# Patient Record
Sex: Male | Born: 1945 | Race: Black or African American | Hispanic: No | Marital: Married | Smoking: Never smoker
Health system: Southern US, Community
[De-identification: ages and names within clinical notes are randomized; demographics above are authoritative.]

## PROBLEM LIST (undated history)

## (undated) DIAGNOSIS — I10 Essential (primary) hypertension: Secondary | ICD-10-CM

## (undated) DIAGNOSIS — R972 Elevated prostate specific antigen [PSA]: Secondary | ICD-10-CM

## (undated) DIAGNOSIS — E785 Hyperlipidemia, unspecified: Secondary | ICD-10-CM

## (undated) DIAGNOSIS — T7840XA Allergy, unspecified, initial encounter: Secondary | ICD-10-CM

## (undated) HISTORY — DX: Allergy, unspecified, initial encounter: T78.40XA

## (undated) HISTORY — DX: Elevated prostate specific antigen (PSA): R97.20

## (undated) HISTORY — PX: COLONOSCOPY: SHX174

## (undated) HISTORY — PX: BIOPSY PROSTATE: PRO28

---

## 1999-08-27 ENCOUNTER — Emergency Department (HOSPITAL_COMMUNITY): Admission: EM | Admit: 1999-08-27 | Discharge: 1999-08-27 | Payer: Self-pay | Admitting: Emergency Medicine

## 2005-02-02 ENCOUNTER — Ambulatory Visit: Payer: Self-pay | Admitting: Internal Medicine

## 2005-02-15 ENCOUNTER — Ambulatory Visit: Payer: Self-pay | Admitting: Internal Medicine

## 2007-11-18 ENCOUNTER — Emergency Department (HOSPITAL_COMMUNITY): Admission: EM | Admit: 2007-11-18 | Discharge: 2007-11-18 | Payer: Self-pay | Admitting: Family Medicine

## 2010-11-04 ENCOUNTER — Inpatient Hospital Stay (INDEPENDENT_AMBULATORY_CARE_PROVIDER_SITE_OTHER)
Admission: RE | Admit: 2010-11-04 | Discharge: 2010-11-04 | Disposition: A | Discharge status: Home / Self Care | Payer: 59 | Source: Ambulatory Visit | Attending: Family Medicine | Admitting: Family Medicine

## 2010-11-04 DIAGNOSIS — J019 Acute sinusitis, unspecified: Secondary | ICD-10-CM

## 2010-11-04 DIAGNOSIS — R05 Cough: Secondary | ICD-10-CM

## 2012-06-03 ENCOUNTER — Emergency Department (HOSPITAL_COMMUNITY)
Admission: EM | Admit: 2012-06-03 | Discharge: 2012-06-03 | Disposition: A | Discharge status: Home / Self Care | Payer: 59 | Source: Home / Self Care | Attending: Emergency Medicine | Admitting: Emergency Medicine

## 2012-06-03 ENCOUNTER — Encounter (HOSPITAL_COMMUNITY): Payer: Self-pay | Admitting: Emergency Medicine

## 2012-06-03 DIAGNOSIS — M5430 Sciatica, unspecified side: Secondary | ICD-10-CM

## 2012-06-03 HISTORY — DX: Hyperlipidemia, unspecified: E78.5

## 2012-06-03 MED ORDER — PREDNISONE 5 MG PO KIT: 1.0000 | PACK | Freq: Every day | ORAL | Status: DC

## 2012-06-03 MED ORDER — MELOXICAM 15 MG PO TABS: 15.0000 mg | ORAL_TABLET | Freq: Every day | ORAL | Status: DC

## 2012-06-03 MED ORDER — CYCLOBENZAPRINE HCL 5 MG PO TABS: 5.0000 mg | ORAL_TABLET | Freq: Three times a day (TID) | ORAL | Status: DC | PRN

## 2012-06-03 MED ORDER — HYDROCODONE-ACETAMINOPHEN 5-325 MG PO TABS: ORAL_TABLET | ORAL | Status: DC

## 2012-06-03 NOTE — ED Notes (Signed)
C/o back pain radiating to left leg.  Patient states this started last Wednesday.  Denies injury to back.  Patches and OTC medication tried but no relief.

## 2012-06-03 NOTE — ED Provider Notes (Signed)
Chief Complaint  Patient presents with  . Back Pain    History of Present Illness:   Gabriel Gardner is a 66 year old male who has had a four-day history of lower back pain radiating down his left leg to the ankle. He denies any injury. He's had no prior history of back pain. The pain is rated 9/10 in intensity. The pain is worse if he gets up from a sitting position, bends, lifts and better if he is active walks a little bit. There is no numbness or tingling in the leg, no bladder or bowel complaints. No abdominal pain, fever, chills, or weight loss.  Review of Systems:  Other than noted above, the patient denies any of the following symptoms: Systemic:  No fever, chills, severe fatigue, or unexplained weight loss. GI:  No abdominal pain, nausea, vomiting, diarrhea, constipation, incontinence of bowel, or blood in stool. GU:  No dysuria, frequency, urgency, or hematuria. No incontinence of urine or difficulty urinating.  M-S:  No neck pain, joint pain, arthritis, or myalgias. Neuro:  No paresthesias, saddle anesthesia, muscular weakness, or progressive neurological deficit.  PMFSH:  Past medical history, family history, social history, meds, and allergies were reviewed. Specifically, there is no history of cancer, major trauma, osteoporosis, immunosuppression, HIV, or IV or injection drug use.   Physical Exam:   Vital signs:  BP 135/85  Pulse 78  Temp 97.7 F (36.5 C) (Oral)  Resp 18  SpO2 94% General:  Alert, oriented, in no distress. Abdomen:  Soft, non-tender.  No organomegaly or mass.  No pulsatile midline abdominal mass or bruit. Back:  There was tenderness to palpation in the left lower back just above the sacroiliac joint. The back had a full range of motion with pain. Straight leg raising was positive on the left with a positive Lasegue's sign and positive popliteal compression. Neuro:  Normal muscle strength, sensations and DTRs. Extremities: Pedal pulses were full, there was no  edema. Skin:  Clear, warm and dry.  No rash.  Assessment:  The encounter diagnosis was Sciatica.  Plan:   1.  The following meds were prescribed:   New Prescriptions   CYCLOBENZAPRINE (FLEXERIL) 5 MG TABLET    Take 1 tablet (5 mg total) by mouth 3 (three) times daily as needed for muscle spasms.   HYDROCODONE-ACETAMINOPHEN (NORCO/VICODIN) 5-325 MG PER TABLET    1 to 2 tabs every 4 to 6 hours as needed for pain.   MELOXICAM (MOBIC) 15 MG TABLET    Take 1 tablet (15 mg total) by mouth daily.   PREDNISONE 5 MG KIT    Take 1 kit (5 mg total) by mouth daily after breakfast. Prednisone 5 mg 6 day dosepack.  Take as directed.   2.  The patient was instructed in symptomatic care and handouts were given. 3.  The patient was told to return if becoming worse in any way, if no better in 2 weeks, and given some red flag symptoms that would indicate earlier return. 4.  The patient was encouraged to try to be as active as possible and given some exercises to do followed by moist heat.    Reuben Likes, MD 06/03/12 2010

## 2014-07-31 DIAGNOSIS — R310 Gross hematuria: Secondary | ICD-10-CM | POA: Diagnosis not present

## 2014-08-12 DIAGNOSIS — J019 Acute sinusitis, unspecified: Secondary | ICD-10-CM | POA: Diagnosis not present

## 2014-08-14 DIAGNOSIS — N281 Cyst of kidney, acquired: Secondary | ICD-10-CM | POA: Diagnosis not present

## 2014-08-14 DIAGNOSIS — R310 Gross hematuria: Secondary | ICD-10-CM | POA: Diagnosis not present

## 2014-08-14 DIAGNOSIS — N3289 Other specified disorders of bladder: Secondary | ICD-10-CM | POA: Diagnosis not present

## 2014-08-14 DIAGNOSIS — N401 Enlarged prostate with lower urinary tract symptoms: Secondary | ICD-10-CM | POA: Diagnosis not present

## 2014-09-18 DIAGNOSIS — N5201 Erectile dysfunction due to arterial insufficiency: Secondary | ICD-10-CM | POA: Diagnosis not present

## 2014-09-18 DIAGNOSIS — R3912 Poor urinary stream: Secondary | ICD-10-CM | POA: Diagnosis not present

## 2014-09-18 DIAGNOSIS — R972 Elevated prostate specific antigen [PSA]: Secondary | ICD-10-CM | POA: Diagnosis not present

## 2014-09-18 DIAGNOSIS — R310 Gross hematuria: Secondary | ICD-10-CM | POA: Diagnosis not present

## 2014-09-18 DIAGNOSIS — N401 Enlarged prostate with lower urinary tract symptoms: Secondary | ICD-10-CM | POA: Diagnosis not present

## 2014-11-04 DIAGNOSIS — J019 Acute sinusitis, unspecified: Secondary | ICD-10-CM | POA: Diagnosis not present

## 2014-11-21 DIAGNOSIS — E785 Hyperlipidemia, unspecified: Secondary | ICD-10-CM | POA: Diagnosis not present

## 2014-11-21 DIAGNOSIS — J309 Allergic rhinitis, unspecified: Secondary | ICD-10-CM | POA: Diagnosis not present

## 2014-11-21 DIAGNOSIS — Z0000 Encounter for general adult medical examination without abnormal findings: Secondary | ICD-10-CM | POA: Diagnosis not present

## 2014-11-25 ENCOUNTER — Encounter: Payer: Self-pay | Admitting: Internal Medicine

## 2015-01-27 ENCOUNTER — Ambulatory Visit (AMBULATORY_SURGERY_CENTER): Payer: Self-pay | Admitting: *Deleted

## 2015-01-27 VITALS — Ht 69.75 in | Wt 190.0 lb

## 2015-01-27 DIAGNOSIS — Z1211 Encounter for screening for malignant neoplasm of colon: Secondary | ICD-10-CM

## 2015-01-27 NOTE — Progress Notes (Signed)
Patient denies any allergies to egg or soy products. Patient denies complications with anesthesia/sedation.  Patient denies oxygen use at home and denies diet medications. Emmi instructions for colonoscopy offered to patient but not interested in Emmi.

## 2015-01-29 ENCOUNTER — Encounter: Payer: Self-pay | Admitting: Internal Medicine

## 2015-02-10 ENCOUNTER — Ambulatory Visit (AMBULATORY_SURGERY_CENTER): Payer: Commercial Managed Care - HMO | Admitting: Internal Medicine

## 2015-02-10 ENCOUNTER — Encounter: Payer: Self-pay | Admitting: Internal Medicine

## 2015-02-10 VITALS — BP 147/84 | HR 84 | Temp 97.6°F | Resp 13 | Ht 69.0 in | Wt 190.0 lb

## 2015-02-10 DIAGNOSIS — Z1211 Encounter for screening for malignant neoplasm of colon: Secondary | ICD-10-CM

## 2015-02-10 DIAGNOSIS — N400 Enlarged prostate without lower urinary tract symptoms: Secondary | ICD-10-CM | POA: Diagnosis not present

## 2015-02-10 DIAGNOSIS — E669 Obesity, unspecified: Secondary | ICD-10-CM | POA: Diagnosis not present

## 2015-02-10 MED ORDER — SODIUM CHLORIDE 0.9 % IV SOLN: 500.0000 mL | INTRAVENOUS | Status: DC

## 2015-02-10 NOTE — Patient Instructions (Signed)
YOU HAD AN ENDOSCOPIC PROCEDURE TODAY AT [facility]:   Refer to the procedure report that was given to you for any specific questions about what was found during the examination.  If the procedure report does not answer your questions, please call your gastroenterologist to clarify.  If you requested that your care partner not be given the details of your procedure findings, then the procedure report has been included in a sealed envelope for you to review at your convenience later.  YOU SHOULD EXPECT: Some feelings of bloating in the abdomen. Passage of more gas than usual.  Walking can help get rid of the air that was put into your GI tract during the procedure and reduce the bloating. If you had a lower endoscopy (such as a colonoscopy or flexible sigmoidoscopy) you may notice spotting of blood in your stool or on the toilet paper. If you underwent a bowel prep for your procedure, you may not have a normal bowel movement for a few days.  Please Note:  You might notice some irritation and congestion in your nose or some drainage.  This is from the oxygen used during your procedure.  There is no need for concern and it should clear up in a day or so.  SYMPTOMS TO REPORT IMMEDIATELY:   Following lower endoscopy (colonoscopy or flexible sigmoidoscopy):  Excessive amounts of blood in the stool  Significant tenderness or worsening of abdominal pains  Swelling of the abdomen that is new, acute  Fever of 100F or higher  For urgent or emergent issues, a gastroenterologist can be reached at any hour by calling [phone].   DIET: Your first meal following the procedure should be a small meal and then it is ok to progress to your normal diet. Heavy or fried foods are harder to digest and may make you feel nauseous or bloated.  Likewise, meals heavy in dairy and vegetables can increase bloating.  Drink plenty of fluids but you should avoid alcoholic beverages for 24 hours. Try to  increase the fiber in your diet.  ACTIVITY:  You should plan to take it easy for the rest of today and you should NOT DRIVE or use heavy machinery until tomorrow (because of the sedation medicines used during the test).    FOLLOW UP: Our staff will call the number listed on your records the next business day following your procedure to check on you and address any questions or concerns that you may have regarding the information given to you following your procedure. If we do not reach you, we will leave a message.  However, if you are feeling well and you are not experiencing any problems, there is no need to return our call.  We will assume that you have returned to your regular daily activities without incident.  If any biopsies were taken you will be contacted by phone or by letter within the next 1-3 weeks.  Please call us at [phone] if you have not heard about the biopsies in 3 weeks.    SIGNATURES/CONFIDENTIALITY: You and/or your care partner have signed paperwork which will be entered into your electronic medical record.  These signatures attest to the fact that that the information above on your After Visit Summary has been reviewed and is understood.  Full responsibility of the confidentiality of this discharge information lies with you and/or your care-partner.  Read all of the handouts given to you by your recovery room nurse. Thank-you for choosing [place]  for your healthcare needs.

## 2015-02-10 NOTE — Progress Notes (Signed)
Report to PACU, RN, vss, BBS= Clear.  

## 2015-02-10 NOTE — Op Note (Signed)
[facility]  [address] [place], [id number]   COLONOSCOPY PROCEDURE REPORT  PATIENT: Gabriel Gardner, Gabriel Gardner  MR#: [id number] BIRTHDATE: March 14, 1946 , 58  yrs. old GENDER: male ENDOSCOPIST: Eustace Quail, MD REFERRED FS:[id number] Recall, M.D. PROCEDURE DATE:  02/10/2015 PROCEDURE:   Colonoscopy, screening First Screening Colonoscopy - Avg.  risk and is 50 yrs.  old or older - No.  Prior Negative Screening - Now for repeat screening. 10 or more years since last screening  History of Adenoma - Now for follow-up colonoscopy & has been > or = to 3 yrs.  N/A  Polyps removed today? No Recommend repeat exam, <10 yrs? No ASA CLASS:   Class II INDICATIONS:Screening for colonic neoplasia and Colorectal Neoplasm Risk Assessment for this procedure is average risk. Negative index exam 02-2005 MEDICATIONS: Propofol 200 mg IV and Monitored anesthesia care  DESCRIPTION OF PROCEDURE:   After the risks benefits and alternatives of the procedure were thoroughly explained, informed consent was obtained.  The digital rectal exam revealed no abnormalities of the rectum.   The LB [id number]  endoscope was introduced through the anus and advanced to the cecum, which was identified by both the appendix and ileocecal valve. No adverse events experienced.   The quality of the prep was excellent. (MiraLax was used)  The instrument was then slowly withdrawn as the colon was fully examined. Estimated blood loss is zero unless otherwise noted in this procedure report.    COLON FINDINGS: There several scattered diverticula.   The examination was otherwise normal.  Retroflexed views revealed no abnormalities. The time to cecum = 2.7 Withdrawal time = 9.2   The scope was withdrawn and the procedure completed. COMPLICATIONS: There were no immediate complications.  ENDOSCOPIC IMPRESSION: 1.   Diverticulosis 2.   The examination was otherwise normal  RECOMMENDATIONS: 1. Continue current  colorectal screening recommendations for "routine risk" patients with a repeat colonoscopy in 10 years.  eSigned:  Eustace Quail, MD 02/10/2015 9:50 AM   cc: The Patient and Juluis Pitch, MD

## 2015-02-11 ENCOUNTER — Telehealth: Payer: Self-pay | Admitting: *Deleted

## 2015-02-11 NOTE — Telephone Encounter (Signed)
Message left

## 2015-09-18 DIAGNOSIS — N281 Cyst of kidney, acquired: Secondary | ICD-10-CM | POA: Diagnosis not present

## 2015-09-18 DIAGNOSIS — R972 Elevated prostate specific antigen [PSA]: Secondary | ICD-10-CM | POA: Diagnosis not present

## 2015-10-01 DIAGNOSIS — N5201 Erectile dysfunction due to arterial insufficiency: Secondary | ICD-10-CM | POA: Diagnosis not present

## 2015-10-01 DIAGNOSIS — Z0000 Encounter for general adult medical examination without abnormal findings: Secondary | ICD-10-CM | POA: Diagnosis not present

## 2015-10-01 DIAGNOSIS — R310 Gross hematuria: Secondary | ICD-10-CM | POA: Diagnosis not present

## 2015-10-01 DIAGNOSIS — N281 Cyst of kidney, acquired: Secondary | ICD-10-CM | POA: Diagnosis not present

## 2015-10-01 DIAGNOSIS — R972 Elevated prostate specific antigen [PSA]: Secondary | ICD-10-CM | POA: Diagnosis not present

## 2015-11-23 DIAGNOSIS — J309 Allergic rhinitis, unspecified: Secondary | ICD-10-CM | POA: Diagnosis not present

## 2015-11-23 DIAGNOSIS — E785 Hyperlipidemia, unspecified: Secondary | ICD-10-CM | POA: Diagnosis not present

## 2015-11-23 DIAGNOSIS — N400 Enlarged prostate without lower urinary tract symptoms: Secondary | ICD-10-CM | POA: Diagnosis not present

## 2015-11-23 DIAGNOSIS — Z0000 Encounter for general adult medical examination without abnormal findings: Secondary | ICD-10-CM | POA: Diagnosis not present

## 2015-12-28 DIAGNOSIS — R221 Localized swelling, mass and lump, neck: Secondary | ICD-10-CM | POA: Diagnosis not present

## 2015-12-29 ENCOUNTER — Other Ambulatory Visit: Payer: Self-pay | Admitting: Family Medicine

## 2015-12-29 DIAGNOSIS — R221 Localized swelling, mass and lump, neck: Secondary | ICD-10-CM

## 2016-01-11 ENCOUNTER — Ambulatory Visit
Admission: RE | Admit: 2016-01-11 | Discharge: 2016-01-11 | Disposition: A | Discharge status: Home / Self Care | Payer: Commercial Managed Care - HMO | Source: Ambulatory Visit | Attending: Family Medicine | Admitting: Family Medicine

## 2016-01-11 DIAGNOSIS — E042 Nontoxic multinodular goiter: Secondary | ICD-10-CM | POA: Diagnosis not present

## 2016-01-11 DIAGNOSIS — R221 Localized swelling, mass and lump, neck: Secondary | ICD-10-CM | POA: Diagnosis not present

## 2016-01-11 DIAGNOSIS — Z113 Encounter for screening for infections with a predominantly sexual mode of transmission: Secondary | ICD-10-CM | POA: Diagnosis not present

## 2016-01-11 DIAGNOSIS — D170 Benign lipomatous neoplasm of skin and subcutaneous tissue of head, face and neck: Secondary | ICD-10-CM | POA: Diagnosis not present

## 2016-01-11 DIAGNOSIS — R030 Elevated blood-pressure reading, without diagnosis of hypertension: Secondary | ICD-10-CM | POA: Diagnosis not present

## 2016-01-11 DIAGNOSIS — D179 Benign lipomatous neoplasm, unspecified: Secondary | ICD-10-CM | POA: Diagnosis not present

## 2016-01-11 DIAGNOSIS — Z7251 High risk heterosexual behavior: Secondary | ICD-10-CM | POA: Diagnosis not present

## 2016-01-11 MED ORDER — IOPAMIDOL (ISOVUE-300) INJECTION 61%
75.0000 mL | Freq: Once | INTRAVENOUS | Status: AC | PRN
Start: 2016-01-11 — End: 2016-01-11
  Administered 2016-01-11: 75 mL via INTRAVENOUS

## 2016-03-23 DIAGNOSIS — R221 Localized swelling, mass and lump, neck: Secondary | ICD-10-CM | POA: Diagnosis not present

## 2016-04-20 DIAGNOSIS — D171 Benign lipomatous neoplasm of skin and subcutaneous tissue of trunk: Secondary | ICD-10-CM | POA: Diagnosis not present

## 2016-05-31 DIAGNOSIS — D170 Benign lipomatous neoplasm of skin and subcutaneous tissue of head, face and neck: Secondary | ICD-10-CM | POA: Diagnosis not present

## 2016-06-25 DIAGNOSIS — J00 Acute nasopharyngitis [common cold]: Secondary | ICD-10-CM | POA: Diagnosis not present

## 2016-08-23 DIAGNOSIS — R972 Elevated prostate specific antigen [PSA]: Secondary | ICD-10-CM | POA: Diagnosis not present

## 2016-10-03 DIAGNOSIS — N401 Enlarged prostate with lower urinary tract symptoms: Secondary | ICD-10-CM | POA: Diagnosis not present

## 2016-10-03 DIAGNOSIS — N5201 Erectile dysfunction due to arterial insufficiency: Secondary | ICD-10-CM | POA: Diagnosis not present

## 2016-10-03 DIAGNOSIS — R972 Elevated prostate specific antigen [PSA]: Secondary | ICD-10-CM | POA: Diagnosis not present

## 2016-10-03 DIAGNOSIS — N281 Cyst of kidney, acquired: Secondary | ICD-10-CM | POA: Diagnosis not present

## 2016-10-03 DIAGNOSIS — R3912 Poor urinary stream: Secondary | ICD-10-CM | POA: Diagnosis not present

## 2016-11-23 DIAGNOSIS — E785 Hyperlipidemia, unspecified: Secondary | ICD-10-CM | POA: Diagnosis not present

## 2016-11-23 DIAGNOSIS — N400 Enlarged prostate without lower urinary tract symptoms: Secondary | ICD-10-CM | POA: Diagnosis not present

## 2016-11-23 DIAGNOSIS — D170 Benign lipomatous neoplasm of skin and subcutaneous tissue of head, face and neck: Secondary | ICD-10-CM | POA: Diagnosis not present

## 2016-11-23 DIAGNOSIS — Z0000 Encounter for general adult medical examination without abnormal findings: Secondary | ICD-10-CM | POA: Diagnosis not present

## 2017-04-28 DIAGNOSIS — R69 Illness, unspecified: Secondary | ICD-10-CM | POA: Diagnosis not present

## 2017-08-14 DIAGNOSIS — I10 Essential (primary) hypertension: Secondary | ICD-10-CM | POA: Diagnosis not present

## 2017-09-27 DIAGNOSIS — I10 Essential (primary) hypertension: Secondary | ICD-10-CM | POA: Diagnosis not present

## 2017-11-24 DIAGNOSIS — E785 Hyperlipidemia, unspecified: Secondary | ICD-10-CM | POA: Diagnosis not present

## 2017-11-24 DIAGNOSIS — N400 Enlarged prostate without lower urinary tract symptoms: Secondary | ICD-10-CM | POA: Diagnosis not present

## 2017-11-24 DIAGNOSIS — I10 Essential (primary) hypertension: Secondary | ICD-10-CM | POA: Diagnosis not present

## 2017-11-24 DIAGNOSIS — Z0000 Encounter for general adult medical examination without abnormal findings: Secondary | ICD-10-CM | POA: Diagnosis not present

## 2018-02-19 DIAGNOSIS — R972 Elevated prostate specific antigen [PSA]: Secondary | ICD-10-CM | POA: Diagnosis not present

## 2018-03-08 DIAGNOSIS — R3912 Poor urinary stream: Secondary | ICD-10-CM | POA: Diagnosis not present

## 2018-03-08 DIAGNOSIS — R310 Gross hematuria: Secondary | ICD-10-CM | POA: Diagnosis not present

## 2018-03-08 DIAGNOSIS — N281 Cyst of kidney, acquired: Secondary | ICD-10-CM | POA: Diagnosis not present

## 2018-03-08 DIAGNOSIS — R972 Elevated prostate specific antigen [PSA]: Secondary | ICD-10-CM | POA: Diagnosis not present

## 2018-03-08 DIAGNOSIS — N401 Enlarged prostate with lower urinary tract symptoms: Secondary | ICD-10-CM | POA: Diagnosis not present

## 2018-03-12 ENCOUNTER — Other Ambulatory Visit: Payer: Self-pay | Admitting: Urology

## 2018-03-12 DIAGNOSIS — R972 Elevated prostate specific antigen [PSA]: Secondary | ICD-10-CM

## 2018-03-23 ENCOUNTER — Ambulatory Visit
Admission: RE | Admit: 2018-03-23 | Discharge: 2018-03-23 | Disposition: A | Discharge status: Home / Self Care | Payer: Medicare HMO | Source: Ambulatory Visit | Attending: Urology | Admitting: Urology

## 2018-03-23 DIAGNOSIS — R972 Elevated prostate specific antigen [PSA]: Secondary | ICD-10-CM

## 2018-03-23 MED ORDER — GADOBENATE DIMEGLUMINE 529 MG/ML IV SOLN
18.0000 mL | Freq: Once | INTRAVENOUS | Status: AC | PRN
  Administered 2018-03-23: 18 mL via INTRAVENOUS

## 2018-04-10 DIAGNOSIS — R310 Gross hematuria: Secondary | ICD-10-CM | POA: Diagnosis not present

## 2018-04-10 DIAGNOSIS — N5201 Erectile dysfunction due to arterial insufficiency: Secondary | ICD-10-CM | POA: Diagnosis not present

## 2018-04-10 DIAGNOSIS — N281 Cyst of kidney, acquired: Secondary | ICD-10-CM | POA: Diagnosis not present

## 2018-04-10 DIAGNOSIS — R972 Elevated prostate specific antigen [PSA]: Secondary | ICD-10-CM | POA: Diagnosis not present

## 2018-05-24 DIAGNOSIS — R972 Elevated prostate specific antigen [PSA]: Secondary | ICD-10-CM | POA: Diagnosis not present

## 2018-05-28 DIAGNOSIS — I10 Essential (primary) hypertension: Secondary | ICD-10-CM | POA: Diagnosis not present

## 2018-05-28 DIAGNOSIS — N400 Enlarged prostate without lower urinary tract symptoms: Secondary | ICD-10-CM | POA: Diagnosis not present

## 2018-05-28 DIAGNOSIS — E785 Hyperlipidemia, unspecified: Secondary | ICD-10-CM | POA: Diagnosis not present

## 2018-06-05 DIAGNOSIS — R310 Gross hematuria: Secondary | ICD-10-CM | POA: Diagnosis not present

## 2018-06-05 DIAGNOSIS — N5201 Erectile dysfunction due to arterial insufficiency: Secondary | ICD-10-CM | POA: Diagnosis not present

## 2018-06-05 DIAGNOSIS — C61 Malignant neoplasm of prostate: Secondary | ICD-10-CM | POA: Diagnosis not present

## 2018-06-05 DIAGNOSIS — R3912 Poor urinary stream: Secondary | ICD-10-CM | POA: Diagnosis not present

## 2018-07-26 DIAGNOSIS — R300 Dysuria: Secondary | ICD-10-CM | POA: Diagnosis not present

## 2018-11-20 DIAGNOSIS — C61 Malignant neoplasm of prostate: Secondary | ICD-10-CM | POA: Diagnosis not present

## 2018-12-10 DIAGNOSIS — N281 Cyst of kidney, acquired: Secondary | ICD-10-CM | POA: Diagnosis not present

## 2018-12-10 DIAGNOSIS — C61 Malignant neoplasm of prostate: Secondary | ICD-10-CM | POA: Diagnosis not present

## 2018-12-10 DIAGNOSIS — N5201 Erectile dysfunction due to arterial insufficiency: Secondary | ICD-10-CM | POA: Diagnosis not present

## 2019-05-21 DIAGNOSIS — C61 Malignant neoplasm of prostate: Secondary | ICD-10-CM | POA: Diagnosis not present

## 2019-05-28 DIAGNOSIS — R3912 Poor urinary stream: Secondary | ICD-10-CM | POA: Diagnosis not present

## 2019-05-28 DIAGNOSIS — N5201 Erectile dysfunction due to arterial insufficiency: Secondary | ICD-10-CM | POA: Diagnosis not present

## 2019-05-28 DIAGNOSIS — C61 Malignant neoplasm of prostate: Secondary | ICD-10-CM | POA: Diagnosis not present

## 2019-08-09 DIAGNOSIS — N529 Male erectile dysfunction, unspecified: Secondary | ICD-10-CM | POA: Diagnosis not present

## 2019-08-09 DIAGNOSIS — N400 Enlarged prostate without lower urinary tract symptoms: Secondary | ICD-10-CM | POA: Diagnosis not present

## 2019-08-09 DIAGNOSIS — E785 Hyperlipidemia, unspecified: Secondary | ICD-10-CM | POA: Diagnosis not present

## 2019-08-09 DIAGNOSIS — I2510 Atherosclerotic heart disease of native coronary artery without angina pectoris: Secondary | ICD-10-CM | POA: Diagnosis not present

## 2019-08-09 DIAGNOSIS — Z0000 Encounter for general adult medical examination without abnormal findings: Secondary | ICD-10-CM | POA: Diagnosis not present

## 2019-08-09 DIAGNOSIS — D229 Melanocytic nevi, unspecified: Secondary | ICD-10-CM | POA: Diagnosis not present

## 2019-08-09 DIAGNOSIS — J309 Allergic rhinitis, unspecified: Secondary | ICD-10-CM | POA: Diagnosis not present

## 2019-08-09 DIAGNOSIS — I10 Essential (primary) hypertension: Secondary | ICD-10-CM | POA: Diagnosis not present

## 2019-08-09 DIAGNOSIS — C61 Malignant neoplasm of prostate: Secondary | ICD-10-CM | POA: Diagnosis not present

## 2019-08-21 DIAGNOSIS — E785 Hyperlipidemia, unspecified: Secondary | ICD-10-CM | POA: Diagnosis not present

## 2019-08-21 DIAGNOSIS — I10 Essential (primary) hypertension: Secondary | ICD-10-CM | POA: Diagnosis not present

## 2019-10-07 DIAGNOSIS — Z0100 Encounter for examination of eyes and vision without abnormal findings: Secondary | ICD-10-CM | POA: Diagnosis not present

## 2019-10-07 DIAGNOSIS — H524 Presbyopia: Secondary | ICD-10-CM | POA: Diagnosis not present

## 2019-10-17 ENCOUNTER — Other Ambulatory Visit: Payer: Self-pay | Admitting: Urology

## 2019-10-17 DIAGNOSIS — C61 Malignant neoplasm of prostate: Secondary | ICD-10-CM

## 2019-11-20 DIAGNOSIS — C61 Malignant neoplasm of prostate: Secondary | ICD-10-CM | POA: Diagnosis not present

## 2019-11-25 ENCOUNTER — Other Ambulatory Visit: Payer: Self-pay

## 2019-11-25 ENCOUNTER — Ambulatory Visit
Admission: RE | Admit: 2019-11-25 | Discharge: 2019-11-25 | Disposition: A | Discharge status: Home / Self Care | Payer: Medicare HMO | Source: Ambulatory Visit | Attending: Urology | Admitting: Urology

## 2019-11-25 DIAGNOSIS — C61 Malignant neoplasm of prostate: Secondary | ICD-10-CM | POA: Diagnosis not present

## 2019-11-25 MED ORDER — GADOBENATE DIMEGLUMINE 529 MG/ML IV SOLN
18.0000 mL | Freq: Once | INTRAVENOUS | Status: AC | PRN
  Administered 2019-11-25: 18 mL via INTRAVENOUS

## 2019-12-02 DIAGNOSIS — C61 Malignant neoplasm of prostate: Secondary | ICD-10-CM | POA: Diagnosis not present

## 2019-12-02 DIAGNOSIS — N281 Cyst of kidney, acquired: Secondary | ICD-10-CM | POA: Diagnosis not present

## 2019-12-02 DIAGNOSIS — N5201 Erectile dysfunction due to arterial insufficiency: Secondary | ICD-10-CM | POA: Diagnosis not present

## 2019-12-02 DIAGNOSIS — R3912 Poor urinary stream: Secondary | ICD-10-CM | POA: Diagnosis not present

## 2020-02-24 DIAGNOSIS — N400 Enlarged prostate without lower urinary tract symptoms: Secondary | ICD-10-CM | POA: Diagnosis not present

## 2020-02-24 DIAGNOSIS — J309 Allergic rhinitis, unspecified: Secondary | ICD-10-CM | POA: Diagnosis not present

## 2020-02-24 DIAGNOSIS — I10 Essential (primary) hypertension: Secondary | ICD-10-CM | POA: Diagnosis not present

## 2020-02-24 DIAGNOSIS — E785 Hyperlipidemia, unspecified: Secondary | ICD-10-CM | POA: Diagnosis not present

## 2020-02-24 DIAGNOSIS — N529 Male erectile dysfunction, unspecified: Secondary | ICD-10-CM | POA: Diagnosis not present

## 2020-03-06 DIAGNOSIS — Z111 Encounter for screening for respiratory tuberculosis: Secondary | ICD-10-CM | POA: Diagnosis not present

## 2020-05-18 DIAGNOSIS — N5201 Erectile dysfunction due to arterial insufficiency: Secondary | ICD-10-CM | POA: Diagnosis not present

## 2020-05-18 DIAGNOSIS — C61 Malignant neoplasm of prostate: Secondary | ICD-10-CM | POA: Diagnosis not present

## 2020-05-18 DIAGNOSIS — R3912 Poor urinary stream: Secondary | ICD-10-CM | POA: Diagnosis not present

## 2021-04-23 ENCOUNTER — Emergency Department: Payer: Medicare Other

## 2021-04-23 ENCOUNTER — Other Ambulatory Visit: Payer: Self-pay

## 2021-04-23 ENCOUNTER — Emergency Department
Admission: EM | Admit: 2021-04-23 | Discharge: 2021-04-23 | Disposition: A | Discharge status: Home / Self Care | Payer: Medicare Other | Attending: Emergency Medicine | Admitting: Emergency Medicine

## 2021-04-23 ENCOUNTER — Encounter: Payer: Self-pay | Admitting: Emergency Medicine

## 2021-04-23 DIAGNOSIS — F17220 Nicotine dependence, chewing tobacco, uncomplicated: Secondary | ICD-10-CM | POA: Diagnosis not present

## 2021-04-23 DIAGNOSIS — Z79899 Other long term (current) drug therapy: Secondary | ICD-10-CM | POA: Insufficient documentation

## 2021-04-23 DIAGNOSIS — Z20822 Contact with and (suspected) exposure to covid-19: Secondary | ICD-10-CM | POA: Diagnosis not present

## 2021-04-23 DIAGNOSIS — R519 Headache, unspecified: Secondary | ICD-10-CM | POA: Diagnosis present

## 2021-04-23 DIAGNOSIS — Z7982 Long term (current) use of aspirin: Secondary | ICD-10-CM | POA: Insufficient documentation

## 2021-04-23 DIAGNOSIS — G44209 Tension-type headache, unspecified, not intractable: Secondary | ICD-10-CM | POA: Insufficient documentation

## 2021-04-23 LAB — RESP PANEL BY RT-PCR (FLU A&B, COVID) ARPGX2
Influenza A by PCR: NEGATIVE
Influenza B by PCR: NEGATIVE
SARS Coronavirus 2 by RT PCR: NEGATIVE

## 2021-04-23 IMAGING — CT CT HEAD W/O CM
3 series · 16 of 47 positions shown, 19 images · non-contrast
Comparison: None.

CLINICAL DATA: Headache

EXAM:
CT HEAD WITHOUT CONTRAST
TECHNIQUE: Contiguous axial images were obtained from the base of the skull
through the vertex without intravenous contrast.

[Series 2: head wo · axial · 0.44mm/px · z∈[-133,+2]mm · 10 of 33 slices shown, 13 images]
[im 3/33  brain]
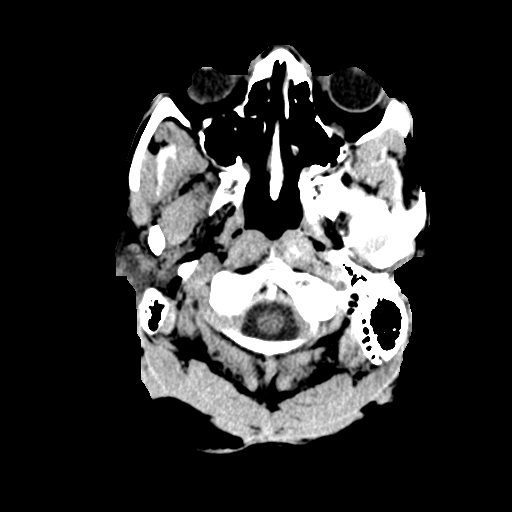
[im 3/33  bone]
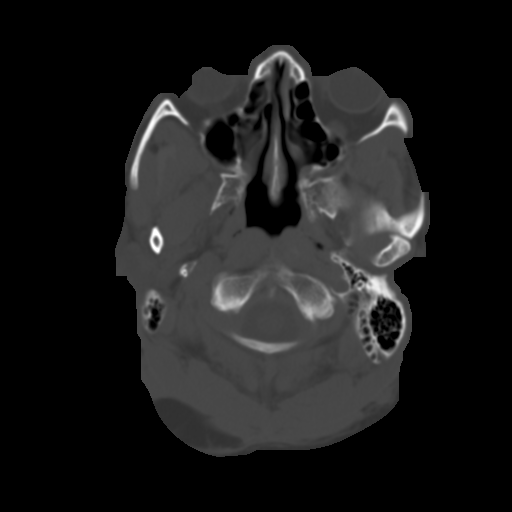
[im 6/33  brain]
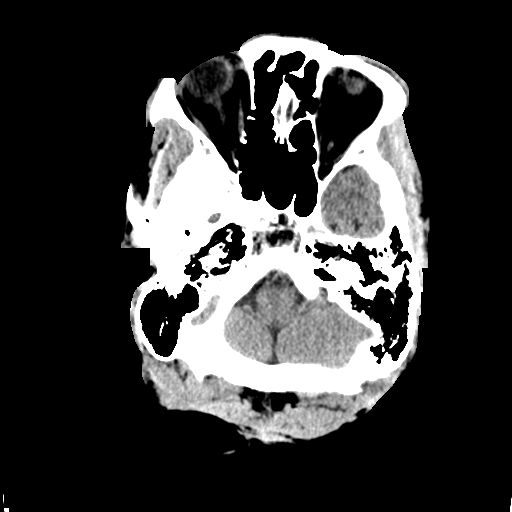
[im 9/33  brain]
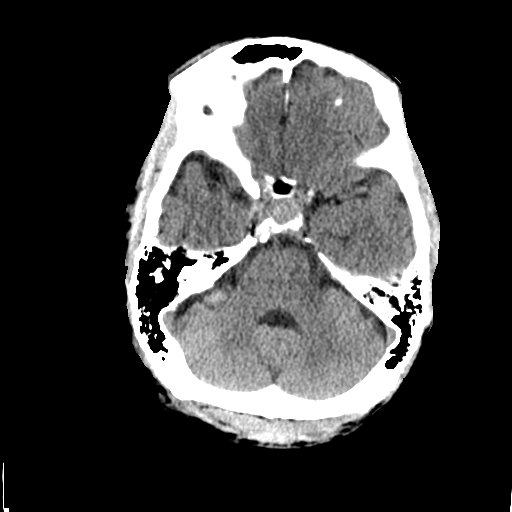
[im 12/33  brain]
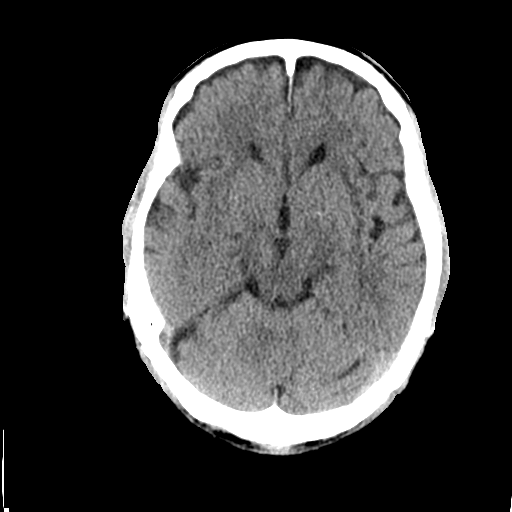
[im 15/33  brain]
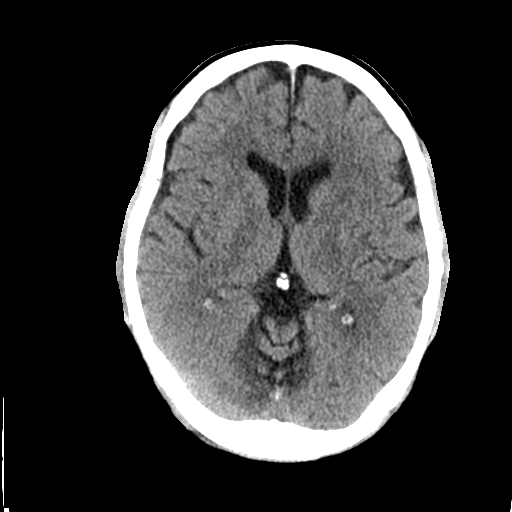
[im 15/33  bone]
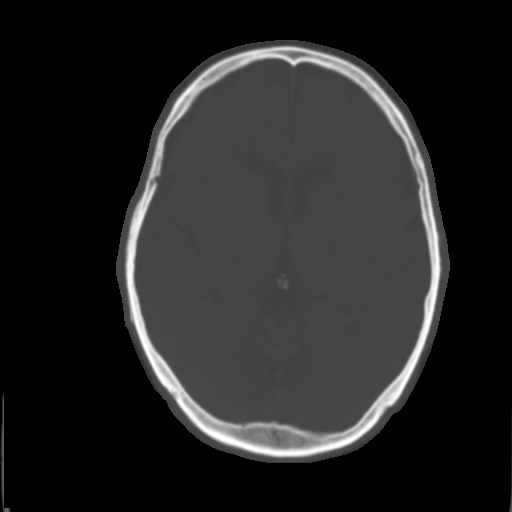
[im 18/33  brain]
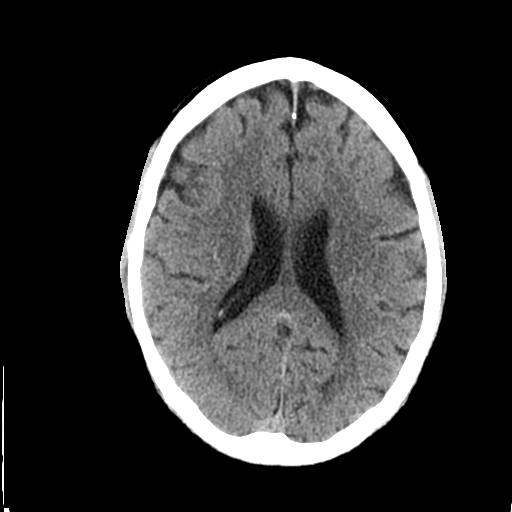
[im 21/33  brain]
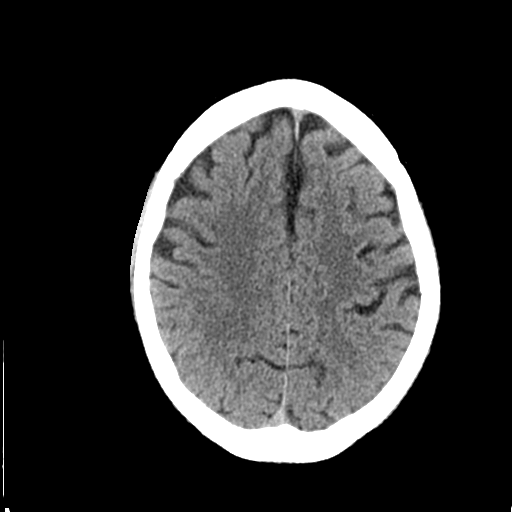
[im 25/33  brain]
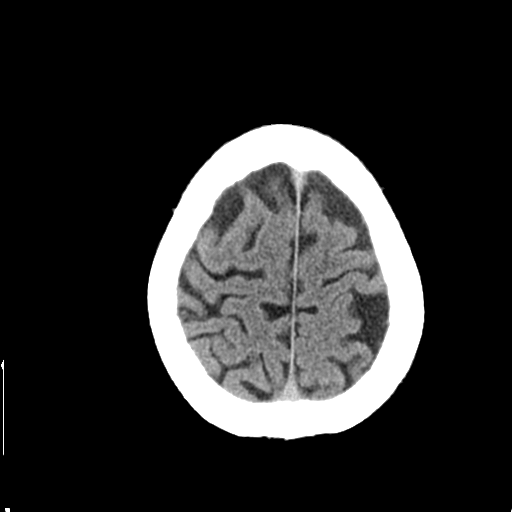
[im 27/33  brain]
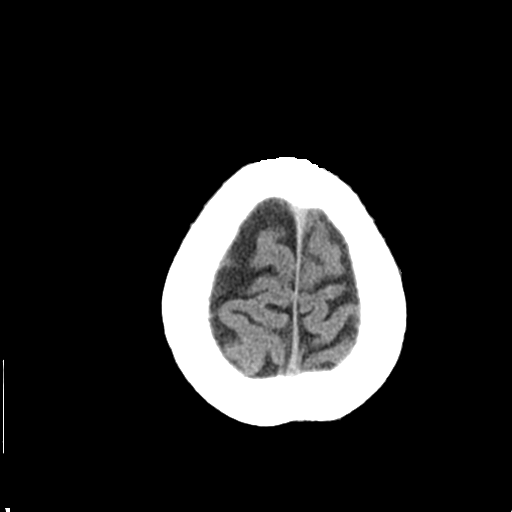
[im 27/33  bone]
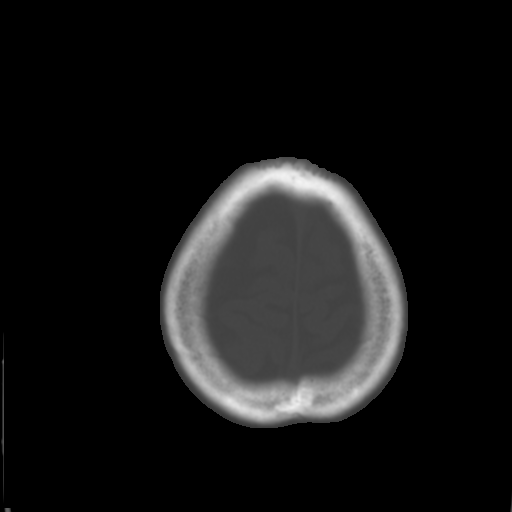
[im 30/33  brain]
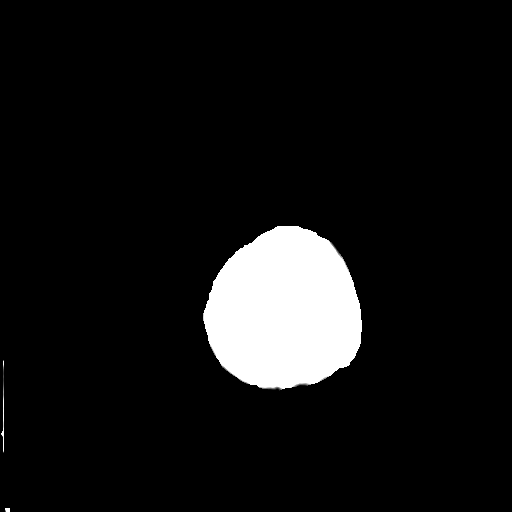

[Series 4: coronal soft tissue · coronal · 0.33mm/px · 3 of 67 slices shown]
[im 23/67  brain]
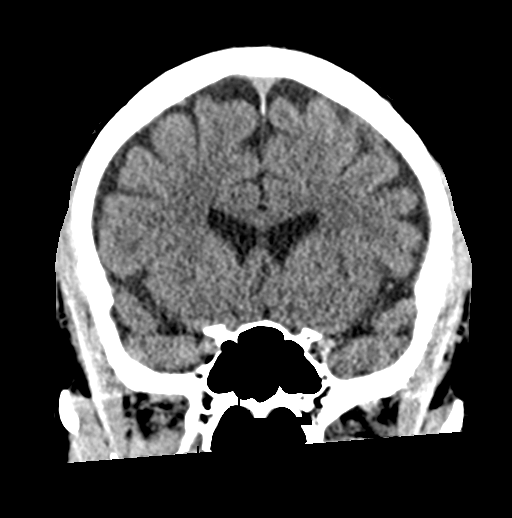
[im 30/67  brain]
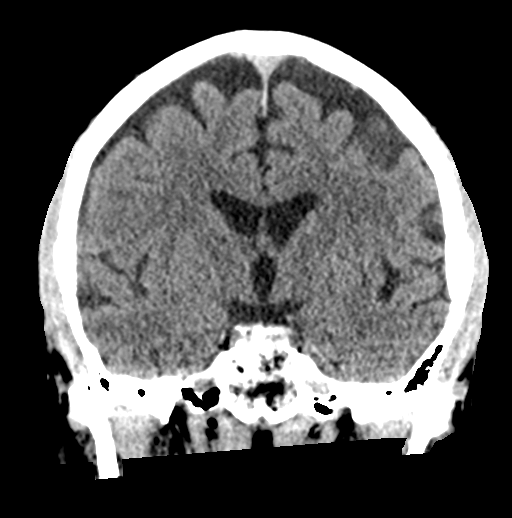
[im 37/67  brain]
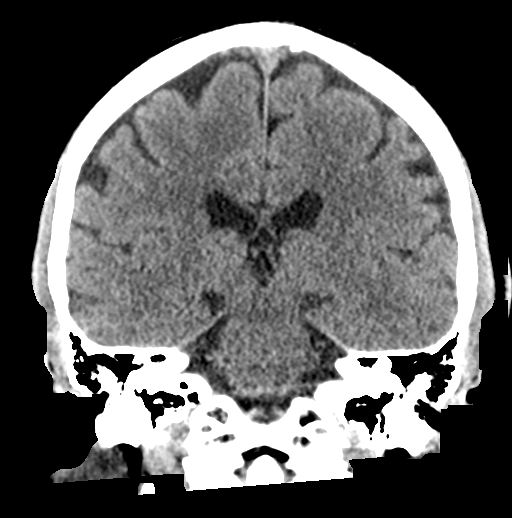

[Series 5: sagittal soft tissue · sagittal · 0.33mm/px · 3 of 57 slices shown]
[im 19/57  brain]
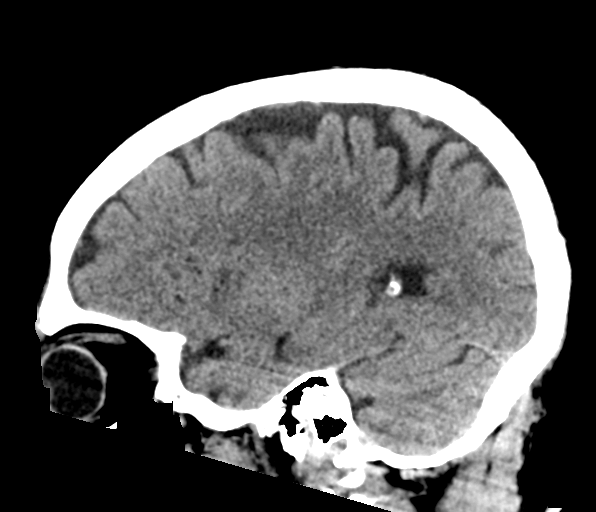
[im 29/57  brain]
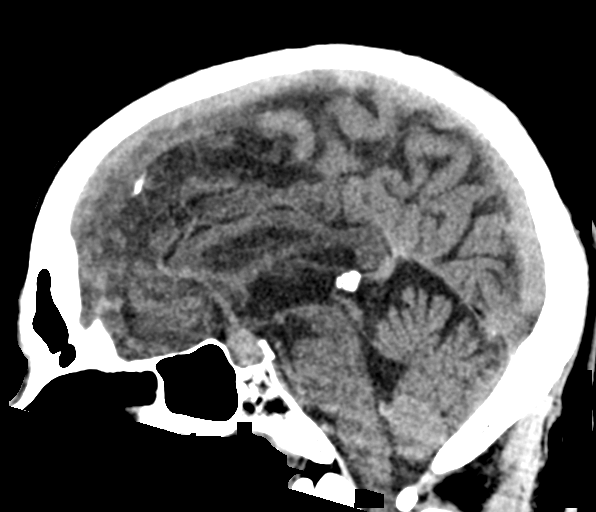
[im 38/57  brain]
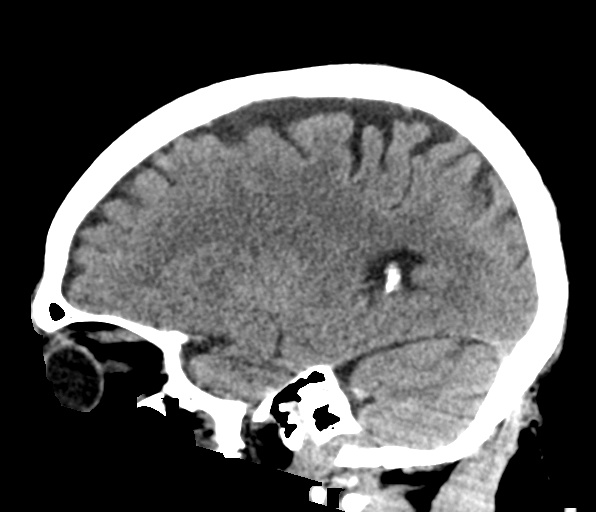

[16 of 47 positions shown; findings below may reference images not displayed]

FINDINGS: Brain: Mild cerebral and cerebellar volume loss. No mass lesion,
hemorrhage, hydrocephalus, acute infarct, intra-axial, or
extra-axial fluid collection.

Vascular: No hyperdense vessel or unexpected calcification.

Skull: Normal

Sinuses/Orbits: Normal imaged portions of the orbits and globes.
Clear paranasal sinuses and mastoid air cells.

Other: None.
IMPRESSION: No acute intracranial abnormality.

Cerebral and cerebellar volume loss.

## 2021-04-23 MED ORDER — BACLOFEN 10 MG PO TABS: 10.0000 mg | ORAL_TABLET | Freq: Three times a day (TID) | ORAL | 0 refills | Status: AC

## 2021-04-23 MED ORDER — HYDROCODONE-ACETAMINOPHEN 5-325 MG PO TABS
1.0000 | ORAL_TABLET | Freq: Once | ORAL | Status: AC
  Administered 2021-04-23: 1 via ORAL
  Filled 2021-04-23: qty 1

## 2021-04-23 NOTE — Discharge Instructions (Signed)
Take tylenol and ibuprofen as needed for pain, limit these medications to 5 days If worsening return to the ER'

## 2021-04-23 NOTE — ED Triage Notes (Signed)
Pt states sinus headache since yesterday, denies fever, walked to triage without difficulty. Does have some relief with tylenol. NAD .

## 2021-04-23 NOTE — ED Provider Notes (Signed)
[facility] Emergency Department Provider Note  ____________________________________________   Event Date/Time   First MD Initiated Contact with Patient 04/23/21 1210     (approximate)  I have reviewed the triage vital signs and the nursing notes.   HISTORY  Chief Complaint Headache    HPI Gabriel Gardner is a 75 y.o. male presents emergency department complaint headache since yesterday.  No fever.  Some sinus pain.  Patient states he has never had a headache like this before.  Took some Tylenol which did help but symptoms have returned.  He denies nausea or vomiting.  Past Medical History:  Diagnosis Date   Allergy    Elevated PSA    Hyperlipemia     There are no problems to display for this patient.   Past Surgical History:  Procedure Laterality Date   BIOPSY PROSTATE     done in office by Dr. Tresa Moore   COLONOSCOPY      Prior to Admission medications   Medication Sig Start Date End Date Taking? Authorizing Provider  baclofen (LIORESAL) 10 MG tablet Take 1 tablet (10 mg total) by mouth 3 (three) times daily for 7 days. 04/23/21 04/30/21 Yes Tyrek Lawhorn, Linden Dolin, PA-C  aspirin 81 MG tablet Take 81 mg by mouth daily.    [provider]  Cyanocobalamin (RA VITAMIN B-12 TR) 1000 MCG TBCR Take by mouth.    [provider]  loratadine (CLARITIN) 10 MG tablet Take 10 mg by mouth as needed for allergies.    [provider]  Multiple Vitamins-Minerals (CENTRUM ADULTS PO) Take 1 tablet by mouth daily.    [provider]  sildenafil (REVATIO) 20 MG tablet Take 1-3 tabs as needed 11/21/14   [provider]  simvastatin (ZOCOR) 20 MG tablet Take 20 mg by mouth every evening.    [provider]  Tamsulosin HCl (FLOMAX PO) Take by mouth.    [provider]    Allergies Patient has no known allergies.  Family History  Problem Relation Age of Onset   Irritable bowel syndrome Father    Esophageal  cancer Father    Heart disease Sister    Colon cancer Neg Hx     Social History Social History   Tobacco Use   Smoking status: Never   Smokeless tobacco: Current    Types: Chew   Tobacco comments:    chews cigar occasionally  Substance Use Topics   Alcohol use: Yes    Alcohol/week: 7.0 standard drinks    Types: 7 Standard drinks or equivalent per week   Drug use: No    Review of Systems  Constitutional: No fever/chills Eyes: No visual changes. ENT: No sore throat. Respiratory: Denies cough Cardiovascular: Denies chest pain Gastrointestinal: Denies abdominal pain Genitourinary: Negative for dysuria. Musculoskeletal: Negative for back pain. Skin: Negative for rash. Psychiatric: no mood changes,     ____________________________________________   PHYSICAL EXAM:  VITAL SIGNS: ED Triage Vitals  Enc Vitals Group     BP 04/23/21 1200 135/86     Pulse Rate 04/23/21 1200 (!) 55     Resp 04/23/21 1200 18     Temp 04/23/21 1203 99 F (37.2 C)     Temp Source 04/23/21 1203 Oral     SpO2 04/23/21 1200 98 %     Weight 04/23/21 1156 192 lb (87.1 kg)     Height 04/23/21 1156 5\' 11"  (1.803 m)     Head Circumference --  Peak Flow --      Pain Score 04/23/21 1156 10     Pain Loc --      Pain Edu? --      Excl. in [place]? --     Constitutional: Alert and oriented. Well appearing and in no acute distress. Eyes: Conjunctivae are normal.  Head: Atraumatic. Nose: No congestion/rhinnorhea. Mouth/Throat: Mucous membranes are moist.   Neck:  supple no lymphadenopathy noted Cardiovascular: Normal rate, regular rhythm. Heart sounds are normal Respiratory: Normal respiratory effort.  No retractions, lungs c t a  GU: deferred Musculoskeletal: FROM all extremities, warm and well perfused Neurologic:  Normal speech and language.  Skin:  Skin is warm, dry and intact. No rash noted. Psychiatric: Mood and affect are normal. Speech and behavior are  normal.  ____________________________________________   LABS (all labs ordered are listed, but only abnormal results are displayed)  Labs Reviewed  RESP PANEL BY RT-PCR (FLU A&B, COVID) [id number]   ____________________________________________   ____________________________________________  RADIOLOGY  CT of the head  ____________________________________________   PROCEDURES  Procedure(s) performed: No  Procedures    ____________________________________________   INITIAL IMPRESSION / ASSESSMENT AND PLAN / ED COURSE  Pertinent labs & imaging results that were available during my care of the patient were reviewed by me and considered in my medical decision making (see chart for details).   Patient 75 year old male presents with headache x1 day.  See HPI.  Physical exam shows a patient to appear stable.  No neurologic deficits noted.  CT of the head due to onset of headache and not responding to Tylenol at this time  Respiratory panel is negative for COVID or influenza  CT of the head reviewed by me confirmed by radiology to be negative for any acute abnormality  Feel this may be more of a tension headache as the patient's sinuses are normal on CT.  He was placed on a muscle relaxer.  He was given Vicodin here in the ED for his headache.  Return emergency department worsening.  Discharged in stable condition.  Gabriel Gardner was evaluated in Emergency Department on 04/23/2021 for the symptoms described in the history of present illness. He was evaluated in the context of the global COVID-19 pandemic, which necessitated consideration that the patient might be at risk for infection with the SARS-CoV-2 virus that causes COVID-19. Institutional protocols and algorithms that pertain to the evaluation of patients at risk for COVID-19 are in a state of rapid change based on information released by regulatory bodies including the CDC and federal and state organizations. These  policies and algorithms were followed during the patient's care in the ED.    As part of my medical decision making, I reviewed the following data within the [facility] History obtained from family, Nursing notes reviewed and incorporated, Labs reviewed , Old chart reviewed, Radiograph reviewed , Notes from prior ED visits, and  Controlled Substance Database  ____________________________________________   FINAL CLINICAL IMPRESSION(S) / ED DIAGNOSES  Final diagnoses:  Tension headache      NEW MEDICATIONS STARTED DURING THIS VISIT:  Discharge Medication List as of 04/23/2021  2:08 PM     START taking these medications   Details  baclofen (LIORESAL) 10 MG tablet Take 1 tablet (10 mg total) by mouth 3 (three) times daily for 7 days., Starting Fri 04/23/2021, Until Fri 04/30/2021, Normal         Note:  This document was prepared using Dragon voice recognition software and  may include unintentional dictation errors.    Versie Starks, PA-C 04/23/21 1525    Naaman Plummer, MD 04/24/21 1524

## 2021-04-26 ENCOUNTER — Emergency Department: Payer: Medicare Other

## 2021-04-26 ENCOUNTER — Encounter: Payer: Self-pay | Admitting: Emergency Medicine

## 2021-04-26 ENCOUNTER — Other Ambulatory Visit: Payer: Self-pay

## 2021-04-26 ENCOUNTER — Emergency Department
Admission: EM | Admit: 2021-04-26 | Discharge: 2021-04-27 | Disposition: A | Discharge status: Other Acute Inpatient Hospital | Payer: Medicare Other | Attending: Emergency Medicine | Admitting: Emergency Medicine

## 2021-04-26 DIAGNOSIS — R000 Tachycardia, unspecified: Secondary | ICD-10-CM | POA: Insufficient documentation

## 2021-04-26 DIAGNOSIS — Z7982 Long term (current) use of aspirin: Secondary | ICD-10-CM | POA: Diagnosis not present

## 2021-04-26 DIAGNOSIS — Z20822 Contact with and (suspected) exposure to covid-19: Secondary | ICD-10-CM | POA: Diagnosis not present

## 2021-04-26 DIAGNOSIS — Y900 Blood alcohol level of less than 20 mg/100 ml: Secondary | ICD-10-CM | POA: Diagnosis not present

## 2021-04-26 DIAGNOSIS — E236 Other disorders of pituitary gland: Secondary | ICD-10-CM

## 2021-04-26 DIAGNOSIS — F17220 Nicotine dependence, chewing tobacco, uncomplicated: Secondary | ICD-10-CM | POA: Insufficient documentation

## 2021-04-26 DIAGNOSIS — E237 Disorder of pituitary gland, unspecified: Secondary | ICD-10-CM | POA: Insufficient documentation

## 2021-04-26 DIAGNOSIS — I639 Cerebral infarction, unspecified: Secondary | ICD-10-CM | POA: Diagnosis not present

## 2021-04-26 DIAGNOSIS — G039 Meningitis, unspecified: Secondary | ICD-10-CM | POA: Insufficient documentation

## 2021-04-26 DIAGNOSIS — R4182 Altered mental status, unspecified: Secondary | ICD-10-CM | POA: Diagnosis not present

## 2021-04-26 DIAGNOSIS — E220 Acromegaly and pituitary gigantism: Secondary | ICD-10-CM | POA: Diagnosis not present

## 2021-04-26 DIAGNOSIS — R519 Headache, unspecified: Secondary | ICD-10-CM | POA: Diagnosis present

## 2021-04-26 LAB — CBC WITH DIFFERENTIAL/PLATELET
Abs Immature Granulocytes: 0.05 10*3/uL (ref 0.00–0.07)
Basophils Absolute: 0.1 10*3/uL (ref 0.0–0.1)
Basophils Relative: 1 %
Eosinophils Absolute: 0.1 10*3/uL (ref 0.0–0.5)
Eosinophils Relative: 1 %
HCT: 49.9 % (ref 39.0–52.0)
Hemoglobin: 16.9 g/dL (ref 13.0–17.0)
Immature Granulocytes: 0 %
Lymphocytes Relative: 27 %
Lymphs Abs: 3.2 10*3/uL (ref 0.7–4.0)
MCH: 29.8 pg (ref 26.0–34.0)
MCHC: 33.9 g/dL (ref 30.0–36.0)
MCV: 88 fL (ref 80.0–100.0)
Monocytes Absolute: 0.9 10*3/uL (ref 0.1–1.0)
Monocytes Relative: 8 %
Neutro Abs: 7.5 10*3/uL (ref 1.7–7.7)
Neutrophils Relative %: 63 %
Platelets: 375 10*3/uL (ref 150–400)
RBC: 5.67 MIL/uL (ref 4.22–5.81)
RDW: 11.9 % (ref 11.5–15.5)
WBC: 11.9 10*3/uL — ABNORMAL HIGH (ref 4.0–10.5)
nRBC: 0 % (ref 0.0–0.2)

## 2021-04-26 LAB — PROTEIN AND GLUCOSE, CSF
Glucose, CSF: 46 mg/dL (ref 40–70)
Total  Protein, CSF: 89 mg/dL — ABNORMAL HIGH (ref 15–45)

## 2021-04-26 LAB — COMPREHENSIVE METABOLIC PANEL
ALT: 17 U/L (ref 0–44)
AST: 19 U/L (ref 15–41)
Albumin: 4.3 g/dL (ref 3.5–5.0)
Alkaline Phosphatase: 48 U/L (ref 38–126)
Anion gap: 14 (ref 5–15)
BUN: 14 mg/dL (ref 8–23)
CO2: 21 mmol/L — ABNORMAL LOW (ref 22–32)
Calcium: 9 mg/dL (ref 8.9–10.3)
Chloride: 100 mmol/L (ref 98–111)
Creatinine, Ser: 1.54 mg/dL — ABNORMAL HIGH (ref 0.61–1.24)
GFR, Estimated: 47 mL/min — ABNORMAL LOW (ref >60)
Glucose, Bld: 82 mg/dL (ref 70–99)
Potassium: 3.5 mmol/L (ref 3.5–5.1)
Sodium: 135 mmol/L (ref 135–145)
Total Bilirubin: 1.3 mg/dL — ABNORMAL HIGH (ref 0.3–1.2)
Total Protein: 8.3 g/dL — ABNORMAL HIGH (ref 6.5–8.1)

## 2021-04-26 LAB — URINALYSIS, COMPLETE (UACMP) WITH MICROSCOPIC
Bilirubin Urine: NEGATIVE
Glucose, UA: NEGATIVE mg/dL
Ketones, ur: 20 mg/dL — AB
Leukocytes,Ua: NEGATIVE
Nitrite: NEGATIVE
Protein, ur: NEGATIVE mg/dL
Specific Gravity, Urine: 1.029 (ref 1.005–1.030)
Squamous Epithelial / HPF: NONE SEEN (ref 0–5)
pH: 5 (ref 5.0–8.0)

## 2021-04-26 LAB — URINE DRUG SCREEN, QUALITATIVE (ARMC ONLY)
Amphetamines, Ur Screen: NOT DETECTED
Barbiturates, Ur Screen: NOT DETECTED
Benzodiazepine, Ur Scrn: NOT DETECTED
Cannabinoid 50 Ng, Ur ~~LOC~~: NOT DETECTED
Cocaine Metabolite,Ur ~~LOC~~: NOT DETECTED
MDMA (Ecstasy)Ur Screen: NOT DETECTED
Methadone Scn, Ur: NOT DETECTED
Opiate, Ur Screen: NOT DETECTED
Phencyclidine (PCP) Ur S: NOT DETECTED
Tricyclic, Ur Screen: NOT DETECTED

## 2021-04-26 LAB — CSF CELL COUNT WITH DIFFERENTIAL
Eosinophils, CSF: 0 %
Lymphs, CSF: 32 %
Monocyte-Macrophage-Spinal Fluid: 20 %
RBC Count, CSF: 16 /mm3 — ABNORMAL HIGH (ref 0–3)
Segmented Neutrophils-CSF: 48 %
Tube #: 3
WBC, CSF: 14 /mm3 (ref 0–5)

## 2021-04-26 LAB — APTT: aPTT: 43 seconds — ABNORMAL HIGH (ref 24–36)

## 2021-04-26 LAB — PROTIME-INR
INR: 1.1 (ref 0.8–1.2)
Prothrombin Time: 14.2 seconds (ref 11.4–15.2)

## 2021-04-26 LAB — LACTIC ACID, PLASMA
Lactic Acid, Venous: 1.3 mmol/L (ref 0.5–1.9)
Lactic Acid, Venous: 1.7 mmol/L (ref 0.5–1.9)

## 2021-04-26 LAB — RESP PANEL BY RT-PCR (FLU A&B, COVID) ARPGX2
Influenza A by PCR: NEGATIVE
Influenza B by PCR: NEGATIVE
SARS Coronavirus 2 by RT PCR: NEGATIVE

## 2021-04-26 LAB — TROPONIN I (HIGH SENSITIVITY): Troponin I (High Sensitivity): 17 ng/L (ref <18)

## 2021-04-26 LAB — ETHANOL: Alcohol, Ethyl (B): <10 mg/dL (ref <10)

## 2021-04-26 LAB — HIV ANTIBODY (ROUTINE TESTING W REFLEX): HIV Screen 4th Generation wRfx: NONREACTIVE

## 2021-04-26 LAB — AMMONIA: Ammonia: <10 umol/L (ref 9–35)

## 2021-04-26 MED ORDER — ACETAMINOPHEN 500 MG PO TABS
1000.0000 mg | ORAL_TABLET | Freq: Once | ORAL | Status: AC
  Administered 2021-04-26: 1000 mg via ORAL
  Filled 2021-04-26: qty 2

## 2021-04-26 MED ORDER — DEXAMETHASONE SODIUM PHOSPHATE 10 MG/ML IJ SOLN
10.0000 mg | Freq: Once | INTRAMUSCULAR | Status: AC
  Administered 2021-04-26: 10 mg via INTRAVENOUS
  Filled 2021-04-26: qty 1

## 2021-04-26 MED ORDER — GADOBUTROL 1 MMOL/ML IV SOLN
8.0000 mL | Freq: Once | INTRAVENOUS | Status: AC | PRN
  Administered 2021-04-26: 8 mL via INTRAVENOUS

## 2021-04-26 MED ORDER — SODIUM CHLORIDE 0.9 % IV SOLN
2.0000 g | Freq: Once | INTRAVENOUS | Status: AC
  Administered 2021-04-26: 2 g via INTRAVENOUS
  Filled 2021-04-26: qty 2000

## 2021-04-26 MED ORDER — SODIUM CHLORIDE 0.9 % IV SOLN: 2.0000 g | Freq: Two times a day (BID) | INTRAVENOUS | Status: DC

## 2021-04-26 MED ORDER — ACETAMINOPHEN 500 MG PO TABS: 1000.0000 mg | ORAL_TABLET | Freq: Once | ORAL | Status: DC

## 2021-04-26 MED ORDER — VANCOMYCIN HCL IN DEXTROSE 1-5 GM/200ML-% IV SOLN
1000.0000 mg | Freq: Once | INTRAVENOUS | Status: AC
Start: 2021-04-26 — End: 2021-04-26
  Administered 2021-04-26: 1000 mg via INTRAVENOUS
  Filled 2021-04-26: qty 200

## 2021-04-26 MED ORDER — VANCOMYCIN HCL 750 MG/150ML IV SOLN
750.0000 mg | Freq: Two times a day (BID) | INTRAVENOUS | Status: DC
  Filled 2021-04-26 (×2): qty 150

## 2021-04-26 MED ORDER — PROCHLORPERAZINE EDISYLATE 10 MG/2ML IJ SOLN
10.0000 mg | Freq: Once | INTRAMUSCULAR | Status: AC
  Administered 2021-04-26: 10 mg via INTRAVENOUS
  Filled 2021-04-26: qty 2

## 2021-04-26 MED ORDER — SODIUM CHLORIDE 0.9 % IV BOLUS
500.0000 mL | Freq: Once | INTRAVENOUS | Status: AC
  Administered 2021-04-26: 500 mL via INTRAVENOUS

## 2021-04-26 MED ORDER — LACTATED RINGERS IV SOLN: INTRAVENOUS | Status: AC

## 2021-04-26 MED ORDER — LACTATED RINGERS IV BOLUS
1000.0000 mL | Freq: Once | INTRAVENOUS | Status: AC
  Administered 2021-04-26: 1000 mL via INTRAVENOUS

## 2021-04-26 MED ORDER — SODIUM CHLORIDE 0.9 % IV SOLN
2.0000 g | Freq: Four times a day (QID) | INTRAVENOUS | Status: DC
  Administered 2021-04-26: 2 g via INTRAVENOUS
  Filled 2021-04-26 (×4): qty 2000

## 2021-04-26 MED ORDER — IOHEXOL 350 MG/ML SOLN
75.0000 mL | Freq: Once | INTRAVENOUS | Status: AC | PRN
  Administered 2021-04-26: 75 mL via INTRAVENOUS

## 2021-04-26 MED ORDER — SODIUM CHLORIDE 0.9 % IV SOLN
2.0000 g | Freq: Once | INTRAVENOUS | Status: AC
  Administered 2021-04-26: 2 g via INTRAVENOUS
  Filled 2021-04-26: qty 20

## 2021-04-26 MED ORDER — VANCOMYCIN HCL IN DEXTROSE 1-5 GM/200ML-% IV SOLN
1000.0000 mg | Freq: Once | INTRAVENOUS | Status: AC
  Administered 2021-04-26: 1000 mg via INTRAVENOUS
  Filled 2021-04-26: qty 200

## 2021-04-26 NOTE — ED Notes (Signed)
[facility]  TRANSPORT  TO  [facility]  ED

## 2021-04-26 NOTE — ED Triage Notes (Addendum)
C/O headache since Thursday. Seen for same on Thursday, daughter states symptoms not improving.  Last medicated with tylenol yesterday.  Daughter states patient has been in the bed since Thursday  C/O forehead pain.  Has taken very little PO since Thursday   Patient is awake and alert.  Diaphoretic.

## 2021-04-26 NOTE — ED Notes (Signed)
Critical WBC in CSF 14.  EDP notified.

## 2021-04-26 NOTE — ED Notes (Signed)
[facility]  TRANSFER C ENTER  CALLED  PER  DR  Creig Hines MD

## 2021-04-26 NOTE — Progress Notes (Signed)
PHARMACY -  BRIEF ANTIBIOTIC NOTE   Pharmacy has received consult(s) for Vancomycin and ampicillin from an ED provider.  The patient's profile has been reviewed for ht/wt/allergies/indication/available labs.    One time order(s) placed by MD for Vancomycin and ampicillin  Further antibiotics/pharmacy consults should be ordered by admitting physician if indicated.                       Thank you, Meadow Abramo A 04/26/2021  8:31 AM

## 2021-04-26 NOTE — Progress Notes (Signed)
Pharmacy Antibiotic Note  Gabriel Gardner is a 75 y.o. male with past medical history of HTN and HDL who presented to ED with a headache that has been bothering him for about 5 days. Pharmacy has been consulted for vancomycin, ceftriaxone, and ampicillin dosing for meningitis.  Plan: Pt to receive 2 g IV loading dose of vanc. Will continue with vancomycin 750 IV every 12 hours.  Goal trough 15-20 mcg/mL. Obtain vanc trough around 4th or 5th dose Ampicillin 2 g IV q6h Ceftriaxone 2 g IV q12h Monitor renal function and adjust dose as clinically indicated  Height: 5\' 11"  (180.3 cm) Weight: 87 kg (191 lb 12.8 oz) IBW/kg (Calculated) : 75.3  Temp (24hrs), Avg:99.8 F (37.7 C), Min:99.4 F (37.4 C), Max:100.2 F (37.9 C)  Recent Labs  Lab 04/26/21 0751 04/26/21 0814  WBC 11.9*  --   CREATININE 1.54*  --   LATICACIDVEN 1.3 1.7    Estimated Creatinine Clearance: 45.5 mL/min (A) (by C-G formula based on SCr of 1.54 mg/dL (H)).    No Known Allergies  Antimicrobials this admission: 11/14 ampicillin >>  11/14 vancomycin >>  11/14 ceftriaxone >>    Microbiology results: 11/14 BCx: sent 11/14 UCx: sent  11/14 CSF: pending  Thank you for allowing pharmacy to be a part of this patient's care.  Livia Tarr O Victorine Mcnee 04/26/2021 1:23 PM

## 2021-04-26 NOTE — ED Notes (Signed)
X[facility]  POWERSHARE  WITH  [facility] 

## 2021-04-26 NOTE — ED Notes (Signed)
EDP in room at this time.

## 2021-04-26 NOTE — ED Provider Notes (Signed)
Patient accepted to [facility] for ED to ED transfer.  Family updated.  Patient is hemodynamically stable.  He is slightly confused but has had no seizure-like activity or new focal neurological deficits.  Vital signs stable.  Stable for transfer.   Duffy Bruce, MD 04/26/21 [phone]

## 2021-04-26 NOTE — ED Provider Notes (Signed)
[facility] Emergency Department Provider Note  ____________________________________________   Event Date/Time   First MD Initiated Contact with Patient 04/26/21 [phone]     (approximate)  I have reviewed the triage vital signs and the nursing notes.   HISTORY  Chief Complaint Headache   HPI Gabriel Gardner is a 75 y.o. male with past medical history of HTN and HDL who presents accompanied by his girlfriend for assessment of a headache that is been bothering him for about 5 days.  He was seen in the ED on 11/11 and diagnosed with possible migraine but feels it is not getting any better.  His girlfriend states he also has been much more sleepy and a little confused.  Patient and girlfriend deny any recent trauma, EtOH use, illicit drug use, vomiting, diarrhea, chest pain, cough, shortness of breath, burning with urination or any pain other than in the head.  No prior similar episodes.  No clear alleviating aggravating factors.  Patient states he is on a baby ASA daily but no other anticoagulation.  No other acute concerns at this time.         Past Medical History:  Diagnosis Date   Allergy    Elevated PSA    Hyperlipemia     There are no problems to display for this patient.   Past Surgical History:  Procedure Laterality Date   BIOPSY PROSTATE     done in office by Dr. Tresa Moore   COLONOSCOPY      Prior to Admission medications   Medication Sig Start Date End Date Taking? Authorizing Provider  amLODipine (NORVASC) 5 MG tablet Take 5 mg by mouth daily. 03/10/21  Yes [provider]  aspirin 81 MG tablet Take 81 mg by mouth daily.   Yes [provider]  baclofen (LIORESAL) 10 MG tablet Take 1 tablet (10 mg total) by mouth 3 (three) times daily for 7 days. 04/23/21 04/30/21 Yes Fisher, Linden Dolin, PA-C  bisoprolol-hydrochlorothiazide [facility]) 2.5-6.25 MG tablet Take 2 tablets by mouth daily. 03/10/21  Yes [provider]   Cyanocobalamin 1000 MCG TBCR Take by mouth.   Yes [provider]  loratadine (CLARITIN) 10 MG tablet Take 10 mg by mouth as needed for allergies.   Yes [provider]  Multiple Vitamins-Minerals (CENTRUM ADULTS PO) Take 1 tablet by mouth daily.   Yes [provider]  sildenafil (REVATIO) 20 MG tablet Take 1-3 tabs as needed 11/21/14  Yes [provider]  simvastatin (ZOCOR) 20 MG tablet Take 20 mg by mouth every evening.   Yes [provider]  Tamsulosin HCl (FLOMAX PO) Take by mouth. Patient not taking: No sig reported    [provider]    Allergies Patient has no known allergies.  Family History  Problem Relation Age of Onset   Irritable bowel syndrome Father    Esophageal cancer Father    Heart disease Sister    Colon cancer Neg Hx     Social History Social History   Tobacco Use   Smoking status: Never   Smokeless tobacco: Current    Types: Chew   Tobacco comments:    chews cigar occasionally  Substance Use Topics   Alcohol use: Yes    Alcohol/week: 7.0 standard drinks    Types: 7 Standard drinks or equivalent per week   Drug use: No    Review of Systems  Review of Systems  Constitutional:  Positive for malaise/fatigue. Negative for chills and fever.  HENT:  Negative for sore throat.   Eyes:  Negative for pain.  Respiratory:  Negative for cough and stridor.   Cardiovascular:  Negative for chest pain.  Gastrointestinal:  Negative for vomiting.  Genitourinary:  Negative for dysuria.  Musculoskeletal:  Negative for myalgias.  Skin:  Negative for rash.  Neurological:  Positive for weakness and headaches. Negative for seizures and loss of consciousness.  Psychiatric/Behavioral:  Positive for memory loss. Negative for suicidal ideas.   All other systems reviewed and are negative.    ____________________________________________   PHYSICAL EXAM:  VITAL SIGNS: ED Triage Vitals  Enc Vitals Group     BP  04/26/21 0741 (!) 75/62     Pulse Rate 04/26/21 0740 (!) 110     Resp 04/26/21 0740 16     Temp 04/26/21 0741 100.2 F (37.9 C)     Temp src --      SpO2 04/26/21 0741 94 %     Weight 04/26/21 0740 191 lb 12.8 oz (87 kg)     Height 04/26/21 0740 5\' 11"  (1.803 m)     Head Circumference --      Peak Flow --      Pain Score 04/26/21 0739 10     Pain Loc --      Pain Edu? --      Excl. in [place]? --    Vitals:   04/26/21 1513 04/26/21 1533  BP: (!) 148/86 140/80  Pulse:  87  Resp: 16 16  Temp:    SpO2:  96%   Physical Exam Vitals and nursing note reviewed.  Constitutional:      Appearance: He is well-developed.  HENT:     Head: Normocephalic and atraumatic.     Right Ear: External ear normal.     Left Ear: External ear normal.     Nose: Nose normal.     Mouth/Throat:     Mouth: Mucous membranes are dry.  Eyes:     Conjunctiva/sclera: Conjunctivae normal.  Cardiovascular:     Rate and Rhythm: Regular rhythm. Tachycardia present.     Heart sounds: No murmur heard. Pulmonary:     Effort: Pulmonary effort is normal. No respiratory distress.     Breath sounds: Normal breath sounds.  Abdominal:     Palpations: Abdomen is soft.     Tenderness: There is no abdominal tenderness.  Musculoskeletal:     Cervical back: Neck supple.  Skin:    General: Skin is warm and dry.     Capillary Refill: Capillary refill takes 2 to 3 seconds.  Neurological:     Mental Status: He is alert.  Psychiatric:        Mood and Affect: Mood normal.    Patient is not oriented to month or year which per girlfriend is abnormal.  Cranial nerves II through XII are grossly intact.  Patient is symmetric strength in his upper and lower extremities.  Sensation is intact to light touch to the lower extremities although on touching patient's right lower extremity states he feels in his left.   ____________________________________________   LABS (all labs ordered are listed, but only abnormal results are  displayed)  Labs Reviewed  COMPREHENSIVE METABOLIC PANEL - Abnormal; Notable for the following components:      Result Value   CO2 21 (*)    Creatinine, Ser 1.54 (*)    Total Protein 8.3 (*)    Total Bilirubin 1.3 (*)    GFR, Estimated 47 (*)  All other components within normal limits  CBC WITH DIFFERENTIAL/PLATELET - Abnormal; Notable for the following components:   WBC 11.9 (*)    All other components within normal limits  APTT - Abnormal; Notable for the following components:   aPTT 43 (*)    All other components within normal limits  URINALYSIS, COMPLETE (UACMP) WITH MICROSCOPIC - Abnormal; Notable for the following components:   Color, Urine STRAW (*)    APPearance CLEAR (*)    Hgb urine dipstick SMALL (*)    Ketones, ur 20 (*)    Bacteria, UA RARE (*)    All other components within normal limits  PROTEIN AND GLUCOSE, CSF - Abnormal; Notable for the following components:   Total  Protein, CSF 89 (*)    All other components within normal limits  CSF CELL COUNT WITH DIFFERENTIAL - Abnormal; Notable for the following components:   RBC Count, CSF 16 (*)    WBC, CSF 14 (*)    All other components within normal limits  CULTURE, BLOOD (ROUTINE X 2)  RESP PANEL BY RT-PCR (FLU A&B, COVID) ARPGX2  CSF CULTURE W GRAM STAIN  CULTURE, BLOOD (ROUTINE X 2)  URINE CULTURE  LACTIC ACID, PLASMA  LACTIC ACID, PLASMA  PROTIME-INR  AMMONIA  URINE DRUG SCREEN, QUALITATIVE ([facility] ONLY)  ETHANOL  URINALYSIS, ROUTINE W REFLEX MICROSCOPIC  HIV ANTIBODY (ROUTINE TESTING W REFLEX)  CYTOLOGY - NON PAP  TROPONIN I (HIGH SENSITIVITY)   ____________________________________________  EKG  ECG remarkable sinus tachycardia with a ventricular rate of 117 and some ST depressions in inferior and lateral leads without other clearance of acute ischemia. ____________________________________________  RADIOLOGY  ED MD interpretation:   Chest x-ray shows some low lung volumes without a focal  consolidation, overt edema, pneumothorax, effusion or other acute thoracic process.  CT head remarkable for a 1.5 cm suprasellar lesion possibly representing an aneurysm versus calcification versus neoplasm.  There is no evidence of intracranial hemorrhage, ischemia, edema or other masses.  Mild chronic small vessel ischemic changes noted.  CTA head without evidence of aneurysm but does show suprasellar lesion previously noted.  Otherwise unremarkable intracranial vasculature.  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  Official radiology report(s): CT Angio Head W or Wo Contrast  Result Date: 04/26/2021 CLINICAL DATA:  Headache since Thursday, suprasellar lesion seen on noncontrast CT head EXAM: CT ANGIOGRAPHY HEAD TECHNIQUE: Multidetector CT imaging of the head was performed using the standard protocol during bolus administration of intravenous contrast. Multiplanar CT image reconstructions and MIPs were obtained to evaluate the vascular anatomy. CONTRAST:  44mL OMNIPAQUE IOHEXOL 350 MG/ML SOLN COMPARISON:  Same-day noncontrast head CT FINDINGS: Anterior circulation: The intracranial ICAs are patent. The bilateral MCAs and ACAs are patent. There is no aneurysm. Specifically, there is no enhancement of the suprasellar lesion seen on the same day noncontrast head CT. Posterior circulation: The bilateral V4 segments are patent. The basilar artery is patent. The bilateral PCAs are patent. There is a fetal origin of the left PCA. There is no aneurysm. Venous sinuses: Poorly evaluated due to contrast bolus timing. Anatomic variants: As above. Other: There is a fat density lesion in the right paraspinal soft tissues in the suboccipital region likely reflecting a lipoma measuring up to 4.2 cm TV x 1.9 cm AP, unchanged. Review of the MIP images confirms the above findings. IMPRESSION: 1. No evidence of  aneurysm. Specifically, there is no  enhancement of the suprasellar lesion seen on the same-day noncontrast head CT. Pituitary protocol MRI with and without contrast is recommended for further evaluation. 2. Unremarkable intracranial vasculature. Electronically Signed   By: Valetta Mole M.D.   On: 04/26/2021 10:11   DG Chest 2 View  Result Date: 04/26/2021 CLINICAL DATA:  Fever and headache. EXAM: CHEST - 2 VIEW COMPARISON:  None. FINDINGS: Two views of the chest demonstrates slightly decreased lung volumes. Heart size is upper limits of normal. Ascending thoracic aorta is prominent. No focal lung disease or pulmonary edema. No large pleural effusions. No acute bone abnormality. IMPRESSION: Low lung volumes without focal lung disease. Cardiomediastinal silhouette is prominent but may be accentuated by the technique and low lung volumes. Electronically Signed   By: Markus Daft M.D.   On: 04/26/2021 08:45   CT HEAD WO CONTRAST (5MM)  Result Date: 04/26/2021 CLINICAL DATA:  Mental status change, unknown cause.  Headache. EXAM: CT HEAD WITHOUT CONTRAST TECHNIQUE: Contiguous axial images were obtained from the base of the skull through the vertex without intravenous contrast. COMPARISON:  Head CT 04/23/2021 FINDINGS: Brain: There is no evidence of an acute infarct, intracranial hemorrhage, midline shift, or extra-axial fluid collection. The ventricles and sulci are within normal limits for age. Hypodensities in the cerebral white matter bilaterally are unchanged and nonspecific but compatible with mild chronic small vessel ischemic disease. There is masslike soft tissue in the suprasellar cistern and sella with a small amount of peripheral calcification measuring approximately 1.5 cm in transverse dimension. Vascular: No hyperdense vessel. Skull: No fracture or suspicious osseous lesion. Sinuses/Orbits: Mild mucosal thickening in the ethmoid and sphenoid sinuses. Clear mastoid air cells. Unremarkable orbits.  Other: Partially visualized lipoma posteriorly in the right upper neck. IMPRESSION: 1. 1.5 cm sellar/suprasellar mass suspicious for neoplasm (such as pituitary adenoma, craniopharyngioma, or meningioma) or possibly aneurysm. Pituitary protocol brain MRI without and with contrast is recommended for further evaluation. 2. No evidence of acute intracranial abnormality. 3. Mild chronic small vessel ischemic disease. Electronically Signed   By: Logan Bores M.D.   On: 04/26/2021 09:03   MR Brain W and Wo Contrast  Result Date: 04/26/2021 CLINICAL DATA:  Suprasellar mass on CT. EXAM: MRI HEAD WITHOUT AND WITH CONTRAST TECHNIQUE: Multiplanar, multiecho pulse sequences of the brain and surrounding structures were obtained without and with intravenous contrast. CONTRAST:  37mL GADAVIST GADOBUTROL 1 MMOL/ML IV SOLN COMPARISON:  Head CT and CTA 04/26/2021 FINDINGS: Brain: There is no evidence of an acute infarct, midline shift, or extra-axial fluid collection. The ventricles and sulci are within normal limits for age. Small T2 hyperintensities in the cerebral white matter bilaterally are nonspecific but compatible with mild chronic small vessel ischemic disease. Dedicated imaging was performed through the sella turcica. There is a 1.3 x 1.4 x 2.2 cm mass which enlarges the sella. The mass is heterogeneously enhancing and demonstrates areas of intrinsic T1 hyperintensity on the left which may reflect subacute hemorrhage. A normal pituitary gland is not identified separate from this mass. The mass extends into the suprasellar cistern and contacts but does not significantly displace the optic chiasm. Left cavernous sinus invasion is questioned. The pituitary infundibulum is deviated rightward. Vascular: Major intracranial vascular flow voids are preserved. Skull and upper cervical spine: Unremarkable bone marrow signal. Sinuses/Orbits: Unremarkable orbits. Mild mucosal thickening in the paranasal sinuses. Clear mastoid air  cells. Other: Lipoma in the posterior right upper neck. 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  Electronically Signed   By: Logan Bores M.D.   On: 04/26/2021 14:05    ____________________________________________   PROCEDURES  Procedure(s) performed (including Critical Care):  .Lumbar Puncture  Date/Time: 04/26/2021 9:40 AM Performed by: Lucrezia Starch, MD Authorized by: Lucrezia Starch, MD   Consent:    Consent obtained:  Verbal and emergent situation (verbal from patient and daughter (please see written doc for further details))   Consent given by:  Patient and healthcare agent   Risks, benefits, and alternatives were discussed: yes     Risks discussed:  Infection, bleeding, headache, pain, nerve damage and repeat procedure   Alternatives discussed:  Delayed treatment Universal protocol:    Procedure explained and questions answered to patient or proxy's satisfaction: yes     Patient identity confirmed:  Provided demographic data Pre-procedure details:    Procedure purpose:  Diagnostic   Preparation: Patient was prepped and draped in usual sterile fashion   Anesthesia:    Anesthesia method:  Local infiltration   Local anesthetic:  Lidocaine 1% w/o epi Procedure details:    Lumbar space:  L3-L4 interspace   Patient position:  Sitting   Needle gauge:  18   Number of attempts:  3   Fluid appearance:  Clear   Tubes of fluid:  4 Post-procedure details:    Puncture site:  Adhesive bandage applied   Procedure completion:  Tolerated well, no immediate complications .Critical Care Performed by: Lucrezia Starch, MD Authorized by: Lucrezia Starch, MD   Critical care provider statement:    Critical care time (minutes):  45   Critical care was necessary to treat or prevent imminent or life-threatening deterioration of the following conditions:  CNS failure or compromise   Critical care was time spent personally by me on the  following activities:  Development of treatment plan with patient or surrogate, discussions with consultants, evaluation of patient's response to treatment, examination of patient, ordering and review of laboratory studies, ordering and review of radiographic studies, ordering and performing treatments and interventions, pulse oximetry, re-evaluation of patient's condition and review of old charts   ____________________________________________   INITIAL IMPRESSION / [place] / ED COURSE        Patient presents with above-stated history exam for assessment of some ongoing headache little worse today over last couple days and some confusion.  On arrival patient has with a temperature 100.2 and otherwise stable vital signs on room air.  He is confused but otherwise has a nonfocal neurological exam.  He is not significant meningitic and has no obvious evidence of trauma.  Differential includes possible atypical meningitis, CVA, metabolic derangements, ingestion, intoxication, and endocrine derangements.  Chest x-ray shows some low lung volumes without a focal consolidation, overt edema, pneumothorax, effusion or other acute thoracic process.  CT head remarkable for a 1.5 cm suprasellar lesion possibly representing an aneurysm versus calcification versus neoplasm.  There is no evidence of intracranial hemorrhage, ischemia, edema or other masses.  Mild chronic small vessel ischemic changes noted.  CTA head without evidence of aneurysm but does show suprasellar lesion previously noted.  Otherwise unremarkable intracranial vasculature.  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  CMP remarkable for creatinine of 1.54 without any other significant electrolyte or metabolic derangements.  This is compared to creatinine obtained on 02/16/2021 of 1.1 representing an AKI today.  CBC shows WBC  count of 11.9 but  is otherwise unremarkable.  EKG with some multiple nonspecific findings but troponin nonelevated not suggestive of ACS.  COVID and influenza PCR is negative.  Ammonia is within normal limits.  Also undetectable.  Lactic acid is nonelevated.  UA with some bacteria but otherwise does not appear infected.  UDS is negative.  Given concern for possible meningitis LP obtained per procedure note above.  Cell count on LP shows elevated WBC 14 and 16 RBCs with 40% Neutrophils concerning for Meningitis.  Gram Stain and Culture Sent.  Chain Is Elevated at 89 and Glucose Is within Normal Limits in CSF.  Patient treated with broad-spectrum antibiotics on arrival for possible meningitis and received IV fluids.  This time I do not believe he is septic.  Discussed concern for possible pituitary adenoma and subacute hemorrhage with on-call neurosurgeon Dr. Lacinda Axon recommended to transfer to [facility] where patient could undergo specialized possible neurosurgical procedures for pituitary hemorrhage unavailable at this hospital in the [place] with referable is a much more experience with this specific type of pituitary problem and then [facility].  Per recommendation from Dr. Lacinda Axon I did reach out to [facility].  Care patient signed over to assuming provider approximately 1600.  Plan is to follow-up with [facility] for transfer.   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[place])  Pituitary gland enlarged ([place])    Medications  lactated ringers infusion (0 mLs Intravenous Paused 04/26/21 1535)  ampicillin (OMNIPEN) 2 g in sodium chloride 0.9 % 100 mL IVPB (2 g Intravenous New Bag/Given 04/26/21 1539)  cefTRIAXone (ROCEPHIN) 2 g in sodium chloride 0.9 % 100 mL IVPB (has no  administration in time range)  vancomycin (VANCOCIN) IVPB 1000 mg/200 mL premix (1,000 mg Intravenous New Bag/Given 04/26/21 1506)  vancomycin (VANCOREADY) IVPB 750 mg/150 mL (has no administration in time range)  sodium chloride 0.9 % bolus 500 mL (0 mLs Intravenous Stopped 04/26/21 0830)  dexamethasone (DECADRON) injection 10 mg (10 mg Intravenous Given 04/26/21 0914)  cefTRIAXone (ROCEPHIN) 2 g in sodium chloride 0.9 % 100 mL IVPB (0 g Intravenous Stopped 04/26/21 0938)  vancomycin (VANCOCIN) IVPB 1000 mg/200 mL premix (0 mg Intravenous Stopped 04/26/21 1039)  ampicillin (OMNIPEN) 2 g in sodium chloride 0.9 % 100 mL IVPB (0 g Intravenous Stopped 04/26/21 0929)  lactated ringers bolus 1,000 mL (0 mLs Intravenous Stopped 04/26/21 1128)  acetaminophen (TYLENOL) tablet 1,000 mg (1,000 mg Oral Given 04/26/21 0912)  iohexol (OMNIPAQUE) 350 MG/ML injection 75 mL (75 mLs Intravenous Contrast Given 04/26/21 0953)  prochlorperazine (COMPAZINE) injection 10 mg (10 mg Intravenous Given 04/26/21 1132)  gadobutrol (GADAVIST) 1 MMOL/ML injection 8 mL (8 mLs Intravenous Contrast Given 04/26/21 1353)     ED Discharge Orders     None        Note:  This document was prepared using Dragon voice recognition software and may include unintentional dictation errors.    Lucrezia Starch, MD 04/26/21 [id number]

## 2021-04-27 LAB — URINE CULTURE: Culture: NO GROWTH

## 2021-04-28 LAB — CYTOLOGY - NON PAP

## 2021-04-29 LAB — CSF CULTURE W GRAM STAIN
Culture: NO GROWTH
Gram Stain: NONE SEEN

## 2021-05-01 LAB — CULTURE, BLOOD (ROUTINE X 2)
Culture: NO GROWTH
Culture: NO GROWTH
Special Requests: ADEQUATE
Special Requests: ADEQUATE

## 2021-12-16 ENCOUNTER — Other Ambulatory Visit: Payer: Self-pay | Admitting: Urology

## 2021-12-16 DIAGNOSIS — C61 Malignant neoplasm of prostate: Secondary | ICD-10-CM

## 2022-01-04 ENCOUNTER — Other Ambulatory Visit
Admission: RE | Admit: 2022-01-04 | Discharge: 2022-01-04 | Disposition: A | Discharge status: Home / Self Care | Payer: Medicare Other | Attending: Urology | Admitting: Urology

## 2022-01-04 DIAGNOSIS — C61 Malignant neoplasm of prostate: Secondary | ICD-10-CM | POA: Insufficient documentation

## 2022-01-04 LAB — BASIC METABOLIC PANEL
Anion gap: 8 (ref 5–15)
BUN: 13 mg/dL (ref 8–23)
CO2: 24 mmol/L (ref 22–32)
Calcium: 8.9 mg/dL (ref 8.9–10.3)
Chloride: 106 mmol/L (ref 98–111)
Creatinine, Ser: 1.34 mg/dL — ABNORMAL HIGH (ref 0.61–1.24)
GFR, Estimated: 56 mL/min — ABNORMAL LOW (ref >60)
Glucose, Bld: 105 mg/dL — ABNORMAL HIGH (ref 70–99)
Potassium: 3.6 mmol/L (ref 3.5–5.1)
Sodium: 138 mmol/L (ref 135–145)

## 2022-01-04 LAB — PSA: Prostatic Specific Antigen: 27.44 ng/mL — ABNORMAL HIGH (ref 0.00–4.00)

## 2022-07-05 ENCOUNTER — Other Ambulatory Visit: Payer: Self-pay | Admitting: Urology

## 2022-07-05 DIAGNOSIS — C61 Malignant neoplasm of prostate: Secondary | ICD-10-CM

## 2022-09-18 ENCOUNTER — Ambulatory Visit
Admission: RE | Admit: 2022-09-18 | Discharge: 2022-09-18 | Disposition: A | Discharge status: Home / Self Care | Payer: Medicare Other | Source: Ambulatory Visit | Attending: Urology | Admitting: Urology

## 2022-09-18 DIAGNOSIS — C61 Malignant neoplasm of prostate: Secondary | ICD-10-CM

## 2022-09-18 MED ORDER — GADOPICLENOL 0.5 MMOL/ML IV SOLN
9.0000 mL | Freq: Once | INTRAVENOUS | Status: AC | PRN
  Administered 2022-09-18: 9 mL via INTRAVENOUS

## 2022-11-24 ENCOUNTER — Other Ambulatory Visit: Payer: Self-pay | Admitting: Family Medicine

## 2022-11-24 DIAGNOSIS — D497 Neoplasm of unspecified behavior of endocrine glands and other parts of nervous system: Secondary | ICD-10-CM

## 2022-12-12 ENCOUNTER — Ambulatory Visit
Admission: RE | Admit: 2022-12-12 | Discharge: 2022-12-12 | Disposition: A | Discharge status: Home / Self Care | Payer: Medicare Other | Source: Ambulatory Visit | Attending: Family Medicine | Admitting: Family Medicine

## 2022-12-12 DIAGNOSIS — D497 Neoplasm of unspecified behavior of endocrine glands and other parts of nervous system: Secondary | ICD-10-CM | POA: Diagnosis present

## 2022-12-12 MED ORDER — GADOBUTROL 1 MMOL/ML IV SOLN
8.0000 mL | Freq: Once | INTRAVENOUS | Status: AC | PRN
  Administered 2022-12-12: 8 mL via INTRAVENOUS

## 2023-11-25 ENCOUNTER — Emergency Department

## 2023-11-25 ENCOUNTER — Other Ambulatory Visit: Payer: Self-pay

## 2023-11-25 ENCOUNTER — Emergency Department: Admission: EM | Admit: 2023-11-25 | Discharge: 2023-11-25 | Disposition: A | Discharge status: Home / Self Care

## 2023-11-25 DIAGNOSIS — D72829 Elevated white blood cell count, unspecified: Secondary | ICD-10-CM | POA: Insufficient documentation

## 2023-11-25 DIAGNOSIS — I10 Essential (primary) hypertension: Secondary | ICD-10-CM | POA: Insufficient documentation

## 2023-11-25 DIAGNOSIS — J069 Acute upper respiratory infection, unspecified: Secondary | ICD-10-CM | POA: Insufficient documentation

## 2023-11-25 DIAGNOSIS — R059 Cough, unspecified: Secondary | ICD-10-CM | POA: Diagnosis present

## 2023-11-25 HISTORY — DX: Essential (primary) hypertension: I10

## 2023-11-25 LAB — COMPREHENSIVE METABOLIC PANEL WITH GFR
ALT: 23 U/L (ref 0–44)
AST: 27 U/L (ref 15–41)
Albumin: 3.6 g/dL (ref 3.5–5.0)
Alkaline Phosphatase: 46 U/L (ref 38–126)
Anion gap: 12 (ref 5–15)
BUN: 24 mg/dL — ABNORMAL HIGH (ref 8–23)
CO2: 26 mmol/L (ref 22–32)
Calcium: 8.9 mg/dL (ref 8.9–10.3)
Chloride: 101 mmol/L (ref 98–111)
Creatinine, Ser: 1.56 mg/dL — ABNORMAL HIGH (ref 0.61–1.24)
GFR, Estimated: 46 mL/min — ABNORMAL LOW (ref >60)
Glucose, Bld: 101 mg/dL — ABNORMAL HIGH (ref 70–99)
Potassium: 3.3 mmol/L — ABNORMAL LOW (ref 3.5–5.1)
Sodium: 139 mmol/L (ref 135–145)
Total Bilirubin: 1.6 mg/dL — ABNORMAL HIGH (ref 0.0–1.2)
Total Protein: 8.1 g/dL (ref 6.5–8.1)

## 2023-11-25 LAB — RESP PANEL BY RT-PCR (RSV, FLU A&B, COVID)  RVPGX2
Influenza A by PCR: NEGATIVE
Influenza B by PCR: NEGATIVE
Resp Syncytial Virus by PCR: NEGATIVE
SARS Coronavirus 2 by RT PCR: NEGATIVE

## 2023-11-25 LAB — CBC WITH DIFFERENTIAL/PLATELET
Abs Immature Granulocytes: 0.05 10*3/uL (ref 0.00–0.07)
Basophils Absolute: 0.1 10*3/uL (ref 0.0–0.1)
Basophils Relative: 0 %
Eosinophils Absolute: 0.1 10*3/uL (ref 0.0–0.5)
Eosinophils Relative: 1 %
HCT: 42.1 % (ref 39.0–52.0)
Hemoglobin: 13.8 g/dL (ref 13.0–17.0)
Immature Granulocytes: 0 %
Lymphocytes Relative: 14 %
Lymphs Abs: 2 10*3/uL (ref 0.7–4.0)
MCH: 28.9 pg (ref 26.0–34.0)
MCHC: 32.8 g/dL (ref 30.0–36.0)
MCV: 88.3 fL (ref 80.0–100.0)
Monocytes Absolute: 1 10*3/uL (ref 0.1–1.0)
Monocytes Relative: 7 %
Neutro Abs: 11.3 10*3/uL — ABNORMAL HIGH (ref 1.7–7.7)
Neutrophils Relative %: 78 %
Platelets: 395 10*3/uL (ref 150–400)
RBC: 4.77 MIL/uL (ref 4.22–5.81)
RDW: 12.4 % (ref 11.5–15.5)
WBC: 14.4 10*3/uL — ABNORMAL HIGH (ref 4.0–10.5)
nRBC: 0 % (ref 0.0–0.2)

## 2023-11-25 LAB — TROPONIN I (HIGH SENSITIVITY): Troponin I (High Sensitivity): 9 ng/L (ref <18)

## 2023-11-25 MED ORDER — GUAIFENESIN 200 MG PO TABS: 400.0000 mg | ORAL_TABLET | ORAL | 0 refills | Status: AC | PRN

## 2023-11-25 NOTE — ED Provider Notes (Signed)
 [facility] Provider Note    Event Date/Time   First MD Initiated Contact with Patient 11/25/23 1125     (approximate)   History   Cough   HPI  Gabriel Gardner is a 78 y.o. male  with history of hypertension and as listed in EMR presents to the emergency department for treatment and evaluation of URI x 4 days. Subjective fever. One episode of vomiting yesterday. Tolerating water today.  Reports general malaise.       Physical Exam   Most recent vital signs: Vitals:   11/25/23 1107 11/25/23 1200  BP: 113/83 123/81  Pulse: 79 76  Resp: 20   Temp: 99.3 F (37.4 C)   SpO2: 97% 94%    General: Awake, no distress.  CV:  Good peripheral perfusion.  Resp:  Normal effort. Breath sounds clear. Abd:  No distention.  Other:     ED Results / Procedures / Treatments   Labs (all labs ordered are listed, but only abnormal results are displayed) Labs Reviewed  CBC WITH DIFFERENTIAL/PLATELET - Abnormal; Notable for the following components:      Result Value   WBC 14.4 (*)    Neutro Abs 11.3 (*)    All other components within normal limits  COMPREHENSIVE METABOLIC PANEL WITH GFR - Abnormal; Notable for the following components:   Potassium 3.3 (*)    Glucose, Bld 101 (*)    BUN 24 (*)    Creatinine, Ser 1.56 (*)    Total Bilirubin 1.6 (*)    GFR, Estimated 46 (*)    All other components within normal limits  RESP PANEL BY RT-PCR (RSV, FLU A&B, COVID)  RVPGX2  TROPONIN I (HIGH SENSITIVITY)     EKG  Not indicated.   RADIOLOGY  Image and radiology report reviewed and interpreted by me. Radiology report consistent with the same.  No acute cardiopulmonary abnormality on chest x-ray  PROCEDURES:  Critical Care performed: No  Procedures   MEDICATIONS ORDERED IN ED:  Medications - No data to display   IMPRESSION / MDM / ASSESSMENT AND PLAN / ED COURSE   I have reviewed the triage note.  Differential diagnosis includes, but is  not limited to, URI, bronchitis, influenza, Covid, pneumonia  Patient's presentation is most consistent with acute illness / injury with system symptoms.  78 year old male presenting to the emergency department for treatment and evaluation of URI symptoms.  See HPI for further details.  Triage vital signs are normal.  Breath sounds are clear on exam.  Labs indicate a mild leukocytosis at 14.4 and a neutrophil count of 11.3.  CMP shows a potassium of 3.3, random glucose of 101, BUN of 24 and a creatinine of 1.56 with a GFR of 46. Old labs reviewed--GFR near baseline.  Troponin is normal chest x-ray is negative for acute concerns.  Respiratory panel is negative.  Plan will be to treat symptomatically with guaifenesin and have him follow-up with his primary care provider if not improving over the next few days.       FINAL CLINICAL IMPRESSION(S) / ED DIAGNOSES   Final diagnoses:  Viral URI with cough     Rx / DC Orders   ED Discharge Orders          Ordered    guaiFENesin 200 MG tablet  Every 4 hours PRN        11/25/23 1328             Note:  This document was prepared using Dragon voice recognition software and may include unintentional dictation errors.   Sherryle Don, FNP 11/25/23 1328    Collis Deaner, MD 11/26/23 1204

## 2023-11-25 NOTE — ED Notes (Signed)
 Patient given ginger ale and saltines per request and with provider's ok.

## 2023-11-25 NOTE — ED Triage Notes (Signed)
 Pt to ED for cold since 4 days. States mild cough and probably mild fever. Also poor appetite and fatigue. Pt alert, skin dry, steady gait. States vomited once today after trying to eat bread. No pain, no meds taken today. No cough noted.

## 2023-12-19 ENCOUNTER — Other Ambulatory Visit (HOSPITAL_COMMUNITY): Payer: Self-pay | Admitting: Urology

## 2023-12-19 DIAGNOSIS — C61 Malignant neoplasm of prostate: Secondary | ICD-10-CM

## 2024-01-03 ENCOUNTER — Encounter (HOSPITAL_COMMUNITY)
Admission: RE | Admit: 2024-01-03 | Discharge: 2024-01-03 | Disposition: A | Discharge status: Home / Self Care | Source: Ambulatory Visit | Attending: Urology | Admitting: Urology

## 2024-01-03 DIAGNOSIS — C61 Malignant neoplasm of prostate: Secondary | ICD-10-CM | POA: Insufficient documentation

## 2024-01-03 MED ORDER — FLOTUFOLASTAT F 18 GALLIUM 296-5846 MBQ/ML IV SOLN
8.6360 | Freq: Once | INTRAVENOUS | Status: AC
  Administered 2024-01-03: 8.636 via INTRAVENOUS
